# Patient Record
Sex: Male | Born: 1955 | Race: Black or African American | Hispanic: No | Marital: Married | Smoking: Former smoker
Health system: Southern US, Community
[De-identification: ages and names within clinical notes are randomized; demographics above are authoritative.]

## PROBLEM LIST (undated history)

## (undated) DIAGNOSIS — J45909 Unspecified asthma, uncomplicated: Secondary | ICD-10-CM

## (undated) DIAGNOSIS — F329 Major depressive disorder, single episode, unspecified: Secondary | ICD-10-CM

## (undated) DIAGNOSIS — R12 Heartburn: Secondary | ICD-10-CM

## (undated) DIAGNOSIS — F32A Depression, unspecified: Secondary | ICD-10-CM

## (undated) DIAGNOSIS — I10 Essential (primary) hypertension: Secondary | ICD-10-CM

## (undated) DIAGNOSIS — C61 Malignant neoplasm of prostate: Secondary | ICD-10-CM

## (undated) HISTORY — DX: Major depressive disorder, single episode, unspecified: F32.9

## (undated) HISTORY — DX: Depression, unspecified: F32.A

## (undated) HISTORY — PX: KNEE SURGERY: SHX244

## (undated) HISTORY — DX: Unspecified asthma, uncomplicated: J45.909

---

## 2002-11-10 ENCOUNTER — Encounter: Payer: Self-pay | Admitting: Family Medicine

## 2002-11-10 HISTORY — PX: COLONOSCOPY: SHX174

## 2009-11-29 ENCOUNTER — Ambulatory Visit: Payer: Self-pay | Admitting: Family Medicine

## 2009-11-29 DIAGNOSIS — M109 Gout, unspecified: Secondary | ICD-10-CM | POA: Insufficient documentation

## 2009-12-14 ENCOUNTER — Ambulatory Visit: Payer: Self-pay | Admitting: Family Medicine

## 2009-12-21 ENCOUNTER — Encounter: Payer: Self-pay | Admitting: Family Medicine

## 2009-12-22 LAB — CONVERTED CEMR LAB
Albumin: 4.6 g/dL (ref 3.5–5.2)
BUN: 20 mg/dL (ref 6–23)
Chloride: 106 meq/L (ref 96–112)
Creatinine, Ser: 1.37 mg/dL (ref 0.40–1.50)
Glucose, Bld: 104 mg/dL — ABNORMAL HIGH (ref 70–99)
PSA: 2.09 ng/mL (ref 0.10–4.00)
Potassium: 4.5 meq/L (ref 3.5–5.3)
Sodium: 140 meq/L (ref 135–145)
Total Bilirubin: 0.5 mg/dL (ref 0.3–1.2)
Triglycerides: 97 mg/dL (ref <150)
Uric Acid, Serum: 8.9 mg/dL — ABNORMAL HIGH (ref 4.0–7.8)

## 2010-01-16 ENCOUNTER — Telehealth: Payer: Self-pay | Admitting: Family Medicine

## 2010-01-25 ENCOUNTER — Telehealth: Payer: Self-pay | Admitting: Family Medicine

## 2010-01-29 ENCOUNTER — Ambulatory Visit: Payer: Self-pay | Admitting: Family Medicine

## 2010-01-29 DIAGNOSIS — E785 Hyperlipidemia, unspecified: Secondary | ICD-10-CM

## 2010-01-30 ENCOUNTER — Telehealth: Payer: Self-pay | Admitting: Family Medicine

## 2010-02-05 ENCOUNTER — Telehealth: Payer: Self-pay | Admitting: Family Medicine

## 2010-07-04 ENCOUNTER — Telehealth: Payer: Self-pay | Admitting: Family Medicine

## 2010-09-12 ENCOUNTER — Ambulatory Visit
Admission: RE | Admit: 2010-09-12 | Discharge: 2010-09-12 | Payer: Self-pay | Source: Home / Self Care | Attending: Family Medicine | Admitting: Family Medicine

## 2010-09-12 ENCOUNTER — Encounter: Payer: Self-pay | Admitting: Family Medicine

## 2010-09-12 DIAGNOSIS — L738 Other specified follicular disorders: Secondary | ICD-10-CM | POA: Insufficient documentation

## 2010-09-12 LAB — CONVERTED CEMR LAB
Bilirubin Urine: NEGATIVE
Glucose, Urine, Semiquant: NEGATIVE
Ketones, urine, test strip: NEGATIVE
Protein, U semiquant: NEGATIVE
Specific Gravity, Urine: 1.02

## 2010-09-13 LAB — CONVERTED CEMR LAB
Basophils Relative: 0 % (ref 0–1)
Eosinophils Absolute: 0.1 10*3/uL (ref 0.0–0.7)
Lymphs Abs: 2.4 10*3/uL (ref 0.7–4.0)
MCHC: 32.9 g/dL (ref 30.0–36.0)
RBC: 4.84 M/uL (ref 4.22–5.81)
RDW: 14.4 % (ref 11.5–15.5)
Uric Acid, Serum: 7.1 mg/dL (ref 4.0–7.8)

## 2010-09-14 ENCOUNTER — Telehealth: Payer: Self-pay | Admitting: Family Medicine

## 2010-09-14 ENCOUNTER — Encounter
Admission: RE | Admit: 2010-09-14 | Discharge: 2010-09-14 | Payer: Self-pay | Source: Home / Self Care | Attending: Family Medicine | Admitting: Family Medicine

## 2010-09-14 ENCOUNTER — Encounter: Payer: Self-pay | Admitting: Family Medicine

## 2010-09-15 ENCOUNTER — Encounter: Payer: Self-pay | Admitting: Family Medicine

## 2010-09-15 ENCOUNTER — Ambulatory Visit
Admission: RE | Admit: 2010-09-15 | Discharge: 2010-09-15 | Payer: Self-pay | Source: Home / Self Care | Admitting: Family Medicine

## 2010-09-18 ENCOUNTER — Telehealth: Payer: Self-pay | Admitting: Family Medicine

## 2010-09-19 ENCOUNTER — Encounter: Payer: Self-pay | Admitting: Family Medicine

## 2010-09-26 ENCOUNTER — Encounter: Payer: Self-pay | Admitting: Family Medicine

## 2010-10-16 NOTE — Progress Notes (Signed)
Summary: Crestor SE  Phone Note Call from Patient   Caller: Patient Summary of Call: Pt states Crestor is causing muscle aches, fatigue, and dizziness. Pt dc'd Rx last Thursday. Pt would like to know if there is anything besides Rx that he can try to help lower cholesterol. Please advise. Initial call taken by: Payton Spark CMA,  Feb 05, 2010 9:19 AM  Follow-up for Phone Call        Let's try Fenofibrate once a day.  It is non-statin cholesterol treatment.  Take daily and repeat labs in 3 mos. Follow-up by: Seymour Bars DO,  Feb 05, 2010 9:41 AM   New Allergies: ! * STATINS New/Updated Medications: FENOFIBRATE 160 MG TABS (FENOFIBRATE) 1 tab by mouth daily New Allergies: ! * STATINSPrescriptions: FENOFIBRATE 160 MG TABS (FENOFIBRATE) 1 tab by mouth daily  #30 x 2   Entered and Authorized by:   Seymour Bars DO   Signed by:   Seymour Bars DO on 02/05/2010   Method used:   Print then Give to Patient   RxID:   [id number]   Appended Document: Crestor SE Spent several minutes on phone w/ Pt. Pt upset bc he has had problems w/ med SE since he started coming her. I explained to Pt that there is no way to tell what SE he will have if he has never tried meds before. I asked Pt to see if SE improve over the next couple of days and if no better call for OV. Pt will wait to start new Rx when all SE are gone.

## 2010-10-16 NOTE — Procedures (Signed)
Summary: Colonoscopy/[facility]  Colonoscopy/[facility]   Imported By: Lanelle Bal 12/20/2009 14:23:18  _____________________________________________________________________  External Attachment:    Type:   Image     Comment:   External Document

## 2010-10-16 NOTE — Assessment & Plan Note (Signed)
Summary: NOV: CPE   Vital Signs:  Patient profile:   55 year old male Height:      72.5 inches Weight:      250 pounds BMI:     33.56 Pulse rate:   76 / minute BP sitting:   134 / 88  (left arm) Cuff size:   large  Vitals Entered By: Kathlene November (December 14, 2009 9:16 AM) CC: NP- CPE Is Patient Diabetic? No   Primary Care Provider:  Nani Gasser MD  CC:  NP- CPE.  History of Present Illness: Has a lump on the right inner knee. Was working out and fell and hit his knee on a bar. Noticed a lump about 2 days later and hasn't resolved since then. Occ tender but otherwise doesn't really bother him.    Was seen in [facility] for gout but couldn't get his colchicine because the pharmacy didn't carry it so would like  me to send  a new rx to a diff pharm that does carry it.  He says hasn't toleratated allopurinol in the past. He has not tried Uloric.   Habits & Providers  Alcohol-Tobacco-Diet     Alcohol drinks/day: <1     Tobacco Status: quit     Year Quit: 2009  Exercise-Depression-Behavior     Does Patient Exercise: no     STD Risk: never     Drug Use: never     Seat Belt Use: always  Current Medications (verified): 1)  Indomethacin 50 Mg Caps (Indomethacin) .Marland Kitchen.. 1 By Mouth 3 Times Daily With Food 2)  Colchicine 0.6 Mg Tabs (Colchicine) .... Sig 1 By Mouth Q Day May Take Up To 3 Aday Prn 3)  Hydrocodone-Acetaminophen 5-325 Mg Tabs (Hydrocodone-Acetaminophen) .... Sig 1 By Mouth Q6-8hrs  Allergies (verified): 1)  ! Codeine 2)  Allopurinol  Comments:  Nurse/Medical Assistant: The patient's medications and allergies were reviewed with the patient and were updated in the Medication and Allergy Lists. Kathlene November (December 14, 2009 9:17 AM)  Past History:  Past Surgical History: Knee-Rt, athroscopic surgery.   Family History: Mother, D, Diabetes CHF, hi chol, HTN passed away age 51.  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  Non-smoker ETOH-yes No Drugs HVAC 1 caffeinated drink per day. Smoking Status:  quit Does Patient Exercise:  no STD Risk:  never Drug Use:  never Seat Belt Use:  always  Review of Systems       No fever/sweats/weakness, + unexplained weight loss/gain.  No vison changes.  No difficulty hearing/ringing in ears, hay fever/allergies.  No chest pain/discomfort, palpitations.  No Br lump/nipple discharge.  No cough/wheeze.  No blood in BM, nausea/vomiting/diarrhea.  No nighttime urination, leaking urine, unusual vaginal bleeding, discharge (penis or vagina).  + muscle/joint pain. No rash, change in mole.  No HA, memory loss.  + anxiety, sleep d/o, no depression.  No easy bruising/bleeding, + unexplained lump   Physical Exam  General:  Well-developed,well-nourished,in no acute distress; alert,appropriate and cooperative throughout examination Head:  Normocephalic and atraumatic without obvious abnormalities. No apparent alopecia or balding. Eyes:  No corneal or conjunctival inflammation noted. EOMI. Perrla. Ears:  External ear exam shows no significant lesions or deformities.  Otoscopic examination reveals clear canals, tympanic membranes are intact bilaterally without bulging, retraction, inflammation or discharge. Hearing is grossly normal bilaterally. Nose:  External nasal examination shows no deformity or inflammation. Nasal mucosa are pink and  moist without lesions or exudates. Mouth:  Oral mucosa and oropharynx without lesions or exudates.  Teeth in good repair. Neck:  No deformities, masses, or tenderness noted. No TM.  Chest Wall:  No deformities, masses, tenderness or gynecomastia noted. Lungs:  Normal respiratory effort, chest expands symmetrically. Lungs are clear to auscultation, no crackles or wheezes. Heart:  Normal rate and regular rhythm. S1 and S2 normal without gallop, murmur, click, rub or other extra sounds. Abdomen:  Bowel sounds positive,abdomen  soft and non-tender without masses, organomegaly or hernias noted. Rectal:  no external abnormalities.   Prostate:  no gland enlargement.   Msk:  No deformity or scoliosis noted of thoracic or lumbar spine.   Pulses:  R and L carotid,radial,dorsalis pedis and posterior tibial pulses are full and equal bilaterally Extremities:  No clubbing, cyanosis, edema, or deformity noted with normal full range of motion of all joints.   Neurologic:  No cranial nerve deficits noted. Station and gait are normal.DTRs are symmetrical throughout. Sensory, motor and coordinative functions appear intact. Skin:  4 cm firm nodule on the rt inner knee. Nontender.  No redness or lesion over the skin.  Cervical Nodes:  No lymphadenopathy noted Psych:  Cognition and judgment appear intact. Alert and cooperative with normal attention span and concentration. No apparent delusions, illusions, hallucinations   Impression & Recommendations:  Problem # 1:  HEALTH MAINTENANCE EXAM (ICD-V70.0) Tdap given today.  Dicussed regular diet and exercise.  Prostate exam was normal, will check PSA as well.  Due for screening labs.  Orders: T-Comprehensive Metabolic Panel [phone]) T-PSA [phone]) T-Lipid Profile [phone])  Problem # 2:  GOUT (ICD-274.9) Will check uric acid.  He would be a good candidate for trying Uloric since hasn't tolerated allopurinol in the past.  The following medications were removed from the medication list:    Colcrys 0.6 Mg Tabs (Colchicine) .Marland Kitchen... Prn His updated medication list for this problem includes:    Indomethacin 50 Mg Caps (Indomethacin) .Marland Kitchen... 1 by mouth 3 times daily with food    Colchicine 0.6 Mg Tabs (Colchicine) ..... Sig 1 by mouth q day may take up to 3 aday prn  Orders: T-Uric Acid (Blood) ([id number])  Complete Medication List: 1)  Indomethacin 50 Mg Caps (Indomethacin) .Marland Kitchen.. 1 by mouth 3 times daily with food 2)  Colchicine 0.6 Mg Tabs (Colchicine) .... Sig 1 by  mouth q day may take up to 3 aday prn 3)  Hydrocodone-acetaminophen 5-325 Mg Tabs (Hydrocodone-acetaminophen) .... Sig 1 by mouth q6-8hrs  Other Orders: Tdap => 53yrs IM (28413) Admin 1st Vaccine (24401)  Patient Instructions: 1)  Encouarage regular exercise.   2)  We will call you with your lab results.   3)  You received Tdap vaccine today.   Prescriptions: COLCHICINE 0.6 MG TABS (COLCHICINE) sig 1 by mouth q day may take up to 3 aday prn  #60 x 0   Entered and Authorized by:   Nani Gasser MD   Signed by:   Nani Gasser MD on 12/14/2009   Method used:   [facility] [phone] (retail[facility]       [address]       [place], [place]  [address]       Ph: [phone] or [phone]       Fax: [phone]   RxID:   [id number]   TD Next Due:  10 yr Flex Sig Next Due:  Not Indicated Colonoscopy  Result Date:  09/16/2006 Colonoscopy Result:  normal Hemoccult Next Due:  Not Indicated     Immunizations Administered:  Tetanus Vaccine:    Vaccine Type: Tdap    Site: left deltoid    Mfr: GlaxoSmithKline    Dose: 0.5 ml    Route: IM    Given by: Kathlene November    Exp. Date: 12/09/2011    Lot #: [id number]    VIS given: 08/04/07 version given December 14, 2009.    Appended Document: NOV: CPE  Colonoscopy Next Due:  10 yr    Preventive Care Screening  Colonoscopy:    Date:  09/16/2006    Next Due:  11/2012    Results:  normal Performed at [facility].

## 2010-10-16 NOTE — Progress Notes (Signed)
Summary: Colchicine refills  Phone Note Refill Request   Refills Requested: Medication #1:  COLCHICINE 0.6 MG TABS Take 1 tab by mouth once daily Rx needs to be faxed to [facility] in [facility] [phone]  Initial call taken by: Payton Spark CMA,  Jan 30, 2010 11:38 AM    Prescriptions: COLCHICINE 0.6 MG TABS (COLCHICINE) Take 1 tab by mouth once daily  #30 x 2   Entered and Authorized by:   Seymour Bars DO   Signed by:   Seymour Bars DO on 01/30/2010   Method used:   Print then Give to Patient   RxID:   [id number]   Appended Document: Colchicine refills Faxed

## 2010-10-16 NOTE — Progress Notes (Signed)
Summary: Side effects from med  Phone Note Call from Patient Call back at [facility] Phone [phone]   Summary of Call: Pt on Uloric and simvastatin- hands , back, feet sore, tires easily. ? side effect to med or interactions between the two Initial call taken by: Kathlene November,  Jan 16, 2010 3:51 PM  Follow-up for Phone Call        These 2 drugs don't interact but could try cutting the simvastatin in half for a couple of weeks ans see if feel any better.  Follow-up by: Nani Gasser MD,  Jan 16, 2010 4:15 PM  Additional Follow-up for Phone Call Additional follow up Details #1::        Pt notified of above instructions. Additional Follow-up by: Kathlene November,  Jan 16, 2010 4:36 PM

## 2010-10-16 NOTE — Progress Notes (Signed)
Summary: Indomethicin  Phone Note Call from Patient   Caller: Patient Call For: Nani Gasser MD Summary of Call: Pt would like to know if you could give him a refill on the Indomethicin. Last filled 11/29/2009 by Dr. Thurmond Butts and hasn't gotten from you. Indomethicin 50mg  one capsule three times a day with food. Having flare Initial call taken by: Kathlene November LPN,  July 04, 2010 9:15 AM  Follow-up for Phone Call        Needs f/u for cholesterol and teh gout.  He really needs to be on uloric or allopurinol. I know at the time was havgin SE that were either from teh choleserol pill or the uloric.  Follow-up by: Nani Gasser MD,  July 04, 2010 12:31 PM    New/Updated Medications: INDOMETHACIN 50 MG CAPS (INDOMETHACIN) Take 1 tablet by mouth three times a day with food for gout flare. Prescriptions: INDOMETHACIN 50 MG CAPS (INDOMETHACIN) Take 1 tablet by mouth three times a day with food for gout flare.  #60 x 0   Entered and Authorized by:   Nani Gasser MD   Signed by:   Nani Gasser MD on 07/04/2010   Method used:   [facility]        [phone][facility] (retail)       [address] [place], [place]  [address]       Ph: [phone] or [phone]       Fax: [phone]   RxID:   [id number]

## 2010-10-16 NOTE — Progress Notes (Signed)
Summary: stopped cholesterol med  Phone Note Call from Patient Call back at Home Phone [phone] Call back at [phone]   Caller: Patient Call For: Nani Gasser MD Summary of Call: Pt stopped the Simvastain due to muslce aches and jaw pain is feeling some better off of it but still hurting. Still having gout signs's even on the Uloric. Initial call taken by: Kathlene November,  Jan 25, 2010 11:04 AM  Follow-up for Phone Call        schedule an appt next wk. Follow-up by: Seymour Bars DO,  Jan 25, 2010 11:54 AM     Appended Document: stopped cholesterol med 01/25/2010 @ 11:55am-Pt notified of MD instructions. KJ LPN

## 2010-10-16 NOTE — Assessment & Plan Note (Signed)
Summary: GOUT IN L FOOT/KH   Vital Signs:  Patient Profile:   55 Years Old Male CC:      Gout in left foot x 2 days Height:     72 inches Weight:      235 pounds O2 Sat:      98 % O2 treatment:    Room Air Temp:     98.9 degrees F oral Pulse rate:   86 / minute Pulse rhythm:   regular Resp:     16 per minute BP sitting:   148 / 101  (right arm) Cuff size:   regular  Vitals Entered By: Avel Sensor, CMA                  Prior Medication List:  No prior medications documented  Current Allergies (reviewed today): ! CODEINEHistory of Present Illness Chief Complaint: Gout in left foot x 2 days History of Present Illness: Gout in L foot . He has been on colchicine before long term but failed taking allopurinol. He had a flare that started yesterday.   Current Problems: GOUT (ICD-274.9) GOUT (ICD-274.9)   Current Meds COLCRYS 0.6 MG TABS (COLCHICINE) prn INDOMETHACIN 50 MG CAPS (INDOMETHACIN) 1 by mouth 3 times daily with food COLCHICINE 0.6 MG TABS (COLCHICINE) sig 1 by mouth q day may take up to 3 aday prn HYDROCODONE-ACETAMINOPHEN 5-325 MG TABS (HYDROCODONE-ACETAMINOPHEN) sig 1 by mouth q6-8hrs  REVIEW OF SYSTEMS Constitutional Symptoms      Denies fever, chills, night sweats, weight loss, weight gain, and fatigue.  Eyes       Denies change in vision, eye pain, eye discharge, glasses, contact lenses, and eye surgery. Ear/Nose/Throat/Mouth       Denies hearing loss/aids, change in hearing, ear pain, ear discharge, dizziness, frequent runny nose, frequent nose bleeds, sinus problems, sore throat, hoarseness, and tooth pain or bleeding.  Respiratory       Denies dry cough, productive cough, wheezing, shortness of breath, asthma, bronchitis, and emphysema/COPD.  Cardiovascular       Denies murmurs, chest pain, and tires easily with exhertion.    Gastrointestinal       Denies stomach pain, nausea/vomiting, diarrhea, constipation, blood in bowel movements, and  indigestion. Genitourniary       Denies painful urination, kidney stones, and loss of urinary control. Neurological       Denies paralysis, seizures, and fainting/blackouts. Musculoskeletal       Complains of muscle pain, joint pain, joint stiffness, and redness.      Denies decreased range of motion, swelling, muscle weakness, and gout.  Skin       Denies bruising, unusual mles/lumps or sores, and hair/skin or nail changes.  Psych       Denies mood changes, temper/anger issues, anxiety/stress, speech problems, depression, and sleep problems.  Past History:  Family History: Last updated: 11/29/2009 Mother, D, Diabetes CHF father, Diabetes  Social History: Last updated: 11/29/2009 Non-smoker ETOH-yes No Drugs HVAC  Past Medical History: Gout  Past Surgical History: Knee-Rt   Family History: Reviewed history and no changes required. Mother, D, Diabetes CHF father, Diabetes  Social History: Reviewed history and no changes required. Non-smoker ETOH-yes No Drugs HVAC Physical Exam General appearance: well developed, well nourished, no acute distress Head: normocephalic, atraumatic Extremities: L foot is swollen over the L metacarpel joint Skin: no obvious rashes or lesions MSE: oriented to time, place, and person Assessment New Problems: GOUT (ICD-274.9) GOUT (ICD-274.9)  gout  Patient Education: Patient and/or  caregiver instructed in the following: rest fluids and Tylenol.  Plan New Medications/Changes: HYDROCODONE-ACETAMINOPHEN 5-325 MG TABS (HYDROCODONE-ACETAMINOPHEN) sig 1 by mouth q6-8hrs  #20 x 0, 11/29/2009, Hassan Rowan MD COLCHICINE 0.6 MG TABS (COLCHICINE) sig 1 by mouth q day may take up to 3 aday prn  #60 x 0, 11/29/2009, Hassan Rowan MD INDOMETHACIN 50 MG CAPS (INDOMETHACIN) 1 by mouth 3 times daily with food  #30 x 0, 11/29/2009, Hassan Rowan MD  New Orders: New Patient Level III [phone] Solumedrol up to 125mg  [J2930] Admin of Therapeutic Inj  (IM or [place]) [02725] Ketorolac-Toradol 15mg  [J1885] Solumedrol up to 125mg  [J2930] Admin of Therapeutic Inj  intramuscular or subcutaneous [96372] Planning Comments:   follow up w/ PCP  Follow Up: Follow up in 1-2 days if no improvement, Follow up in 2-3 days if no improvement, Follow up with Primary Physician  The patient and/or caregiver has been counseled thoroughly with regard to medications prescribed including dosage, schedule, interactions, rationale for use, and possible side effects and they verbalize understanding.  Diagnoses and expected course of recovery discussed and will return if not improved as expected or if the condition worsens. Patient and/or caregiver verbalized understanding.  Prescriptions: HYDROCODONE-ACETAMINOPHEN 5-325 MG TABS (HYDROCODONE-ACETAMINOPHEN) sig 1 by mouth q6-8hrs  #20 x 0   Entered and Authorized by:   Hassan Rowan MD   Signed by:   Hassan Rowan MD on 11/29/2009   Method used:   Print then Give to Patient   RxID:   [id number] COLCHICINE 0.6 MG TABS (COLCHICINE) sig 1 by mouth q day may take up to 3 aday prn  #60 x 0   Entered and Authorized by:   Hassan Rowan MD   Signed by:   Hassan Rowan MD on 11/29/2009   Method used:   Print then Give to Patient   RxID:   [id number] INDOMETHACIN 50 MG CAPS (INDOMETHACIN) 1 by mouth 3 times daily with food  #30 x 0   Entered and Authorized by:   Hassan Rowan MD   Signed by:   Hassan Rowan MD on 11/29/2009   Method used:   Print then Give to Patient   RxID:   [id number]   Patient Instructions: 1)  Please schedule a follow-up appointment as needed. 2)  Please schedule an appointment with your primary doctor inas scheduled.  3)  May need to continue colchicine daily and another medication since unable to tolerate allopurinol.   Medication Administration  Injection # 1:    Medication: Solumedrol up to 125mg     Diagnosis: GOUT (ICD-274.9)    Route: IM    Site: LUOQ gluteus    Exp Date:  05/17/2012    Lot #: [id number]    Mfr: Novaplus    Patient tolerated injection without complications    Given by: Emilio Math (November 29, 2009 1:05 PM)  Injection # 2:    Medication: Ketorolac-Toradol 15mg     Diagnosis: GOUT (ICD-274.9)    Route: IM    Site: LUOQ gluteus    Exp Date: 06/17/2011    Lot #: [id number]    Mfr: Hospira    Comments: x4    Given by: Emilio Math (November 29, 2009 1:08 PM)  Orders Added: 1)  New Patient Level III [99203] 2)  Solumedrol up to 125mg  [J2930] 3)  Admin of Therapeutic Inj (IM or [place]) [96372] 4)  Ketorolac-Toradol 15mg  [J1885] 5)  Solumedrol up to 125mg  [J2930] 6)  Admin of Therapeutic Inj  intramuscular or subcutaneous [25427]

## 2010-10-16 NOTE — Assessment & Plan Note (Signed)
Summary: med SEs/ high chol/ gout   Vital Signs:  Patient profile:   55 year old male Height:      72.5 inches Weight:      245 pounds BMI:     32.89 O2 Sat:      97 % on Room air Pulse rate:   68 / minute BP sitting:   134 / 94  (left arm) Cuff size:   large  Vitals Entered By: Payton Spark CMA (Jan 29, 2010 8:19 AM)  O2 Flow:  Room air CC: F/U. Discuss simvastatin and uloric SE's.   Primary Care Provider:  Nani Gasser MD  CC:  F/U. Discuss simvastatin and uloric SE's..  History of Present Illness: 55 yo AAM presents for f/u high cholesterol and gout.  Dr Linford Arnold started him on Simvastatin last month and he started having myalgias and jaw pain right away.  He had a uric acid level of 8.9.  He was started on Uloric.  His 'reaction' to allopurinol in the past was gouty exacerbation, not a true allergy.  Before, he was doing fine on one tab of colchine daily and rarely having gouty attacks.  He stopped the uloric at the same time as the statin b/c he was not sure which gave himt he SEs that are now gone.    Current Medications (verified): 1)  Colchicine 0.6 Mg Tabs (Colchicine) .... Sig 1 By Mouth Q Day May Take Up To 3 Aday Prn 2)  Hydrocodone-Acetaminophen 5-325 Mg Tabs (Hydrocodone-Acetaminophen) .... Sig 1 By Mouth Q6-8hrs  Allergies (verified): 1)  ! Codeine  Past History:  Past Medical History: Gout high cholesterol class I obesity  Past Surgical History: Reviewed history from 12/14/2009 and no changes required. Knee-Rt, athroscopic surgery.   Social History: Reviewed history from 12/14/2009 and no changes required. 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  Non-smoker ETOH-yes No Drugs HVAC 1 caffeinated drink per day.   Review of Systems  The patient denies fever, chest pain, and dyspnea on exertion.    Physical Exam  General:  alert, well-developed, well-nourished, well-hydrated, and overweight-appearing.     Head:  normocephalic, atraumatic, and male-pattern balding.   Eyes:  sclera non icteric; PERRLA Nose:  no nasal discharge.   Mouth:  good dentition and pharynx pink and moist.   Neck:  no masses.   Lungs:  Normal respiratory effort, chest expands symmetrically. Lungs are clear to auscultation, no crackles or wheezes. Heart:  Normal rate and regular rhythm. S1 and S2 normal without gallop, murmur, click, rub or other extra sounds. Msk:  no joint swelling, no joint warmth, and no redness over joints.   Pulses:  2+ radial pulses Extremities:  no UE or LE edema Skin:  color normal.   Cervical Nodes:  No lymphadenopathy noted Psych:  good eye contact, not anxious appearing, and not depressed appearing.     Impression & Recommendations:  Problem # 1:  HYPERLIPIDEMIA (ICD-272.4) SEs from simvastatin, never tried other statins.  Will give him samples of Crestor 10 mg to break in half at bedtime.  RTC in 1 month to see how he is doing.  Call if any problems.   The following medications were removed from the medication list:    Simvastatin 40 Mg Tabs (Simvastatin) .Marland Kitchen... Take 1 tablet by mouth once a day at bedtime His updated medication list for this problem includes:    Crestor 10 Mg Tabs (Rosuvastatin calcium) .Marland Kitchen... 1/2 tab by  mouth qhs  Labs Reviewed: SGOT: 21 (12/21/2009)   SGPT: 16 (12/21/2009)   HDL:53 (12/21/2009)  LDL:164 (12/21/2009)  Chol:236 (12/21/2009)  Trig:97 (12/21/2009)  Problem # 2:  GOUT (ICD-274.9) Off uloric and feeling better (which may have been from the statin SE).  For now, will keep him on one tab of colchicine daily but will consider either retry of uloric or allopurinol as both of these meds can exacerbate gout w/o an NSAID on board.  Will work to get a stable cholsterol med first. The following medications were removed from the medication list:    Uloric 40 Mg Tabs (Febuxostat) .Marland Kitchen... Take 1 tablet by mouth once a day His updated medication list for this problem  includes:    Colchicine 0.6 Mg Tabs (Colchicine) ..... Sig 1 by mouth q day may take up to 3 aday prn  Problem # 3:  ELEVATED BLOOD PRESSURE WITHOUT DIAGNOSIS OF HYPERTENSION (ICD-796.2) Assessment: New BP high today even on recheck. Given h/o from [email or web address] on DASH diet and encouraged him to adopt a healthier diet with regular exercise for elevated BP and cholesterol.  REcheck BP at next visit prior to a label of HTN.  Complete Medication List: 1)  Colchicine 0.6 Mg Tabs (Colchicine) .... Sig 1 by mouth q day may take up to 3 aday prn 2)  Crestor 10 Mg Tabs (Rosuvastatin calcium) .... 1/2 tab by mouth qhs  Patient Instructions: 1)  Start 1/2 tab of Crestor each night for high cholesterol. 2)  If you tolerate it well, will prescribe. 3)  Stay on 1 tab of colchicine daily for gout. 4)  Return for f/u elevated BP/ gout/ high cholesterol in 1 month.

## 2010-10-18 NOTE — Assessment & Plan Note (Signed)
Summary: Gout, folliculties   Vital Signs:  Patient profile:   55 year old male Height:      72.5 inches Weight:      242 pounds Pulse rate:   84 / minute BP sitting:   165 / 108  (right arm) Cuff size:   large  Vitals Entered By: Avon Gully CMA, Duncan Dull) (September 12, 2010 3:15 PM) CC: gout flare?,back pain feet and legs hurt and rt hand hurts   Primary Care Provider:  Nani Gasser MD  CC:  gout flare? and back pain feet and legs hurt and rt hand hurts.  History of Present Illness: Having pain in his feet and it moves around.Last night in his right thumb and the right lateral ankle.  The indomethacine is making nauseated. His back is also bother im him. Says so stiff and painful yesterday had a hard time getting out of bed. Says in the low back and radiates upwards to the right side. No radiation into the legs.  Says is some beter today.  Thinks it is gout in his back. He has tried allopurinol and uloric in the past. Teh uloric made him SOB.  Says the allopurinol made his sxs worse.   Also rash on the back of his scalp. Notices it comes when his gout flares. AOften takes weaks fo the bumps to resolve. Occ tender but most of the time not bothersome.  NO worsening or alleivating sxs. NO meds.   Current Medications (verified): 1)  Indomethacin 50 Mg Caps (Indomethacin) .... Take 1 Tablet By Mouth Three Times A Day With Food For Gout Flare.  Allergies (verified): 1)  ! Codeine 2)  ! * Statins  Comments:  Nurse/Medical Assistant: The patient's medications and allergies were reviewed with the patient and were updated in the Medication and Allergy Lists. Avon Gully CMA, Duncan Dull) (September 12, 2010 3:16 PM)  Physical Exam  General:  Well-developed,well-nourished,in no acute distress; alert,appropriate and cooperative throughout examination Msk:  Mild inflammation and redness of the left great toe joint No redness but some tenderness over the lateral malleolus.  Right  thumb base is tender but no redness today.  Skin:  Back of scalp with some small erythematous follicular papules. No pustules.    Impression & Recommendations:  Problem # 1:  GOUT (ICD-274.9) Need to go back to Purine free diet. I suspect Holiday foods have worsened his gout. Dsicussed the need for prophylaxis and that he may flar more frequently for the first 6 months.  Start prednisone for this acute flare. Given a PPI to take with it then when finished can start the indomethicin once a day.    His updated medication list for this problem includes:    Indomethacin 50 Mg Caps (Indomethacin) .Marland Kitchen... Take 1 tablet by mouth once a day    Allopurinol 100 Mg Tabs (Allopurinol) .Marland Kitchen... Take 1 tablet by mouth once a day  Orders: T-Uric Acid (Blood) (11914-78295) T-CBC w/Diff (62130-86578) UA Dipstick w/o Micro (automated)  (81003)  Problem # 2:  FOLLICULITIS (ICD-704.8) Discussed can use topical metro gel for as needed use when sees the bumps are breaking out. Avoid friction like hats over the area.   Complete Medication List: 1)  Indomethacin 50 Mg Caps (Indomethacin) .... Take 1 tablet by mouth once a day 2)  Allopurinol 100 Mg Tabs (Allopurinol) .... Take 1 tablet by mouth once a day 3)  Prednisone 20 Mg Tabs (Prednisone) .... 2 tab by mouth for 7 days, then increase to  tab for one week. 4)  Metronidazole 0.75 % Gel (Metronidazole) .... Apply two times a day to affected area on neck for 7 days.  Patient Instructions: 1)  Start your allopurinol today once a day.   2)  Start the prednisone today.  3)  Take the stomach medicine (samples once a day about 20 min before breakfast) 4)  Once finish the prednisone then start the indomethicin once a day wiht the allopurinol.   5)  Follow up in 2 months.  Prescriptions: METRONIDAZOLE 0.75 % GEL (METRONIDAZOLE) apply two times a day to affected area on neck for 7 days.  #1 tube x 2   Entered and Authorized by:   Nani Gasser MD   Signed by:    Nani Gasser MD on 09/12/2010   Method used:   [facility] [phone][facility] (retail)       [address]       [place], [place]  [address]       Ph: [phone] or [phone]       Fax: [phone]   RxID:   [id number] INDOMETHACIN 50 MG CAPS (INDOMETHACIN) Take 1 tablet by mouth once a day  #30 x 1   Entered and Authorized by:   Nani Gasser MD   Signed by:   Nani Gasser MD on 09/12/2010   Method used:   Elect[facility]ically to        [facility] [phone][facility] (retail)       [address]       [place], [place]  [address]       Ph: [phone] or [phone]       Fax: [phone]   RxID:   [id number] PREDNISONE 20 MG TABS (PREDNISONE) 2 tab by mouth for 7 days, then increase to  tab for one week.  #21 x 0   Entered and Authorized by:   Nani Gasser MD   Signed by:   Nani Gasser MD on 09/12/2010   Method used:   [facility] to        [facility] [phone]* (retail)       [address]       [place], [place]  [address]       Ph: [phone] or [phone]       Fax: [phone]   RxID:   [id number] ALLOPURINOL 100 MG TABS (ALLOPURINOL) Take 1 tablet by mouth once a day  #30 x 1   Entered and Authorized by:   Nani Gasser MD   Signed by:   Nani Gasser MD on 09/12/2010   Method used:   Electronically to        [facility] [phone][facility] (retail)       [address]       [place], [place]  [address]       Ph: [phone] or [phone]       Fax: [phone]   RxID:   [id number]    Orders Added: 1)  T-Uric Acid (Blood) [84550-23180] 2)  T-CBC w/Diff [99833-82505] 3)  UA Dipstick w/o Micro (automated)  [81003] 4)  Est. Patient Level IV [39767]    Laboratory Results   Urine Tests  Date/Time Received: 09/12/10 Date/Time Reported: 09/12/10  Routine Urinalysis   Color: yellow Appearance: Clear Glucose: negative   (Normal Range: Negative) Bilirubin: negative   (Normal Range:  Negative) Ketone: negative   (Normal Range:  Negative) Spec. Gravity: 1.020   (Normal Range: 1.003-1.035) Blood: negative   (Normal Range: Negative) pH: 5.5   (Normal Range: 5.0-8.0) Protein: negative   (Normal Range: Negative) Urobilinogen: 0.2   (Normal Range: 0-1) Nitrite: negative   (Normal Range: Negative) Leukocyte Esterace: negative   (Normal Range: Negative)

## 2010-10-18 NOTE — Letter (Signed)
Summary: [employer]  [facility] [employer]   Imported By: Lanelle Bal 10/11/2010 09:19:34  _____________________________________________________________________  External Attachment:    Type:   Image     Comment:   External Document

## 2010-10-18 NOTE — Letter (Signed)
Summary: Out of Work  [facility]  [address]   [place], [place] [address]   Phone: [phone]  Fax: [phone]    September 14, 2010   Employee:  KEITON COSMA    To Whom It May Concern:   For Medical reasons, please excuse the above named employee from work for the following dates:  Start:   09/14/2010  End:   09/17/2010  If you need additional information, please feel free to contact our office.         Sincerely,    Nani Gasser, MD

## 2010-10-18 NOTE — Progress Notes (Signed)
Summary: Gout and his work  Phone Note Call from Patient Call back at [phone]   Caller: Patient Call For: Nani Gasser MD Summary of Call: Bradley Harper to [facility] yesterday and was told to stop the Allopurinol and they put him on a pain med. Told Allopurinol was making the gout worse. Pt frustrated because he can't go back to work with ankle. Prednisone is helping but he is gonna be out and then his pain is gonna be worse. His work is wanting to put him on emergency duty which commits him to perform this duty. He wants to know what the treatment plan needs to be because he knows the gout is gonna come back and wants to be on some therapy that can get this under control. Indomethicin does not help the gout for him at all. Was given cortisone shot at [facility] which did help but pain is still there. 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 I f misses more than 3 days in a row I may have to fill out FMLA if that is what hsi work requires. Lets send himi to rheumatology then. Lets try to get him in quick and we can extend his steroids a little longer if needed if still having pain and inflamation.  Follow-up by: Nani Gasser MD,  September 18, 2010 1:54 PM  Additional Follow-up for Phone Call Additional follow up Details #1::        Pt needs letter stating that it would not be in his best interest health wise to go on the emergency duty because he will continue to have the gout flares and when has these can not work due to the pain and swelling that it causes. Also that when has flares until under control will have to miss work.  He did say that his HR dept said they dont  require FMLA papers to be filled out but they are giving him a form that will need to filled out and he will drop that by in the morning. His wife will pick up the letter this afternoon so he can get it to his job before they terminate him. Also [facility] [address] [facility] [place]. Willing to do the Rheumatology referral as well in [place] Additional Follow-up by: Kathlene November LPN,  September 18, 2010 2:09 PM    Additional Follow-up for Phone Call Additional follow up Details #2::    Please fax letter ot [phone] Attention Benedict Needy Follow-up by: Kathlene November LPN,  September 18, 2010 3:36 PM  Additional Follow-up for Phone Call Additional follow up Details #3:: Details for Additional Follow-up Action Taken: Letter printed. OK to fax.  09/19/2010 @ 8:06am- Letter faxed to above number Additional Follow-up by: Nani Gasser MD,  September 19, 2010 7:57 AM

## 2010-10-18 NOTE — Letter (Signed)
Summary: Generic Letter  [facility]  [address]   [place], [place] [address]   Phone: [phone]  Fax: [phone]    09/25/2010    In regards to Mr. Bradley Harper, Bradley Harper [address] [place], [place]  [address]  He is currently under my care for gout. We are having a difficult time controlling his pain and thus I have referred him to a rheumatologist. In the meantime I did give a stronger narcotic pain medicine to take in the evenings. He cannot take this medication and then be called into work on emergency duty as it is not safe for him to drive for 4 hours after he takes the medication. Thus for now I stand by my original statement that I do not recommend he be on emergency duty until he sees the rheumatologist.  I don't know if some other accomodation can be made.  At this point in time he can work his usual 40 hours per week, shifts of 8 hours, with no more than 8 hours in a day.  If you have any further questions please feel free to contact my office.  Sincerely,     Nani Gasser MD

## 2010-10-18 NOTE — Letter (Signed)
Summary: Out of Work  [facility]  [address]   [place], [place] [address]   Phone: [phone]  Fax: [phone]    September 15, 2010   Employee:  Bradley Harper    To Whom It May Concern:   For Medical reasons, please excuse the above named employee from work 09/17/10.    If you need additional information, please feel free to contact our office.         Sincerely,    Donna Christen MD

## 2010-10-18 NOTE — Letter (Signed)
Summary: Generic Letter  [facility]  [address]   [place], [place] [address]   Phone: [phone]  Fax: [phone]    09/19/2010  Bradley Harper [address] [place], [place]  [address]  Regarding [name],   He has a diagnosis of gout and we are currently struggling to get his symptoms under good control. Gout does flare unexpectedly from time to time. For then next 3 months I ask that you do not put him on emergency duty, because if he has to work that duty then the long hours will likely exacerbate his gout. We are working diligently to get him in with a rheumatologist but it will likey be a month before we can get him in. In the meantime he is on anti-inflammtory medication to help control his pain and swelling. Please let us know if we can provide any further information to assist you.   Sincerely,       Nani Gasser MD

## 2010-10-18 NOTE — Progress Notes (Signed)
Summary: Let Dr. Ashley Mariner he is on crutches now  Phone Note Call from Patient   Caller: Patient Summary of Call: pt. wanted to let you know that he is on crutches now... Thanks.Michaelle Copas  September 14, 2010 2:20 PM  Initial call taken by: Michaelle Copas,  September 14, 2010 2:20 PM  Follow-up for Phone Call        Why don't we get an xray. Can he come this afternoon for xray of his feet bilat. Which foot is the worst?  Follow-up by: Nani Gasser MD,  September 14, 2010 2:36 PM  Additional Follow-up for Phone Call Additional follow up Details #1::        Pt will come for xray. States its his ankles that are really bothering him. Send xray order to [facility] Additional Follow-up by: Kathlene November LPN,  September 14, 2010 2:39 PM    Additional Follow-up for Phone Call Additional follow up Details #2::    pt states left foot is the worse Follow-up by: Avon Gully CMA, Duncan Dull),  September 14, 2010 3:29 PM

## 2010-10-18 NOTE — Assessment & Plan Note (Signed)
Summary: GOUT/TM room 5   Vital Signs:  Patient Profile:   55 Years Old Male CC:      gout Height:     72.5 inches Weight:      242 pounds O2 Sat:      97 % O2 treatment:    Room Air Temp:     98.9 degrees F oral Pulse rate:   90 / minute Resp:     18 per minute BP sitting:   131 / 84  (right arm) Cuff size:   large  Vitals Entered By: Clemens Catholic LPN (September 15, 2010 12:44 PM)                  Updated Prior Medication List: INDOMETHACIN 50 MG CAPS (INDOMETHACIN) Take 1 tablet by mouth once a day ALLOPURINOL 100 MG TABS (ALLOPURINOL) Take 1 tablet by mouth once a day PREDNISONE 20 MG TABS (PREDNISONE) 2 tab by mouth for 7 days, then increase to  tab for one week. METRONIDAZOLE 0.75 % GEL (METRONIDAZOLE) apply two times a day to affected area on neck for 7 days. HYDROCODONE-ACETAMINOPHEN 5-500 MG TABS (HYDROCODONE-ACETAMINOPHEN) 1-2 tabs every 6 hours as needed  Current Allergies (reviewed today): ! CODEINE ! * STATINSHistory of Present Illness Chief Complaint: gout History of Present Illness:  Subjective:  Patient complains of a flareup of gout 4 days ago, despite having just finished a course of colcrys.  He has just started on a course of prednisone but has not yet seen improvement.  Recently started back on allopurinol.  Has tried uloric in past but had adverse effects.  Presently having pain in right knee, left ankle, and left foot.  No fever.  States he is watching his diet and drinking plenty of water.  REVIEW OF SYSTEMS Constitutional Symptoms      Denies fever, chills, night sweats, weight loss, weight gain, and fatigue.  Eyes       Denies change in vision, eye pain, eye discharge, glasses, contact lenses, and eye surgery. Ear/Nose/Throat/Mouth       Denies hearing loss/aids, change in hearing, ear pain, ear discharge, dizziness, frequent runny nose, frequent nose bleeds, sinus problems, sore throat, hoarseness, and tooth pain or bleeding.  Respiratory     Denies dry cough, productive cough, wheezing, shortness of breath, asthma, bronchitis, and emphysema/COPD.  Cardiovascular       Denies murmurs, chest pain, and tires easily with exhertion.    Gastrointestinal       Denies stomach pain, nausea/vomiting, diarrhea, constipation, blood in bowel movements, and indigestion. Genitourniary       Denies painful urination, kidney stones, and loss of urinary control. Neurological       Denies paralysis, seizures, and fainting/blackouts. Musculoskeletal       Complains of muscle pain, joint pain, joint stiffness, redness, and swelling.      Denies decreased range of motion, muscle weakness, and gout.  Skin       Denies bruising, unusual mles/lumps or sores, and hair/skin or nail changes.  Psych       Denies mood changes, temper/anger issues, anxiety/stress, speech problems, depression, and sleep problems. Other Comments: pt c/o left foot pain and ankle swelling. he states that he saw dr Linford Arnold on wed for gout in multiple places and was started on prednisone and allopurinol. pain started in eft foot yest. and dr Linford Arnold ordered xray which the pt states was negative. dr Linford Arnold started him on hydrocodone yesterday.    Past History:  Past Medical History: Reviewed history from 01/29/2010 and no changes required. Gout high cholesterol class I obesity  Past Surgical History: Reviewed history from 12/14/2009 and no changes required. Knee-Rt, athroscopic surgery.   Family History: Reviewed history from 12/14/2009 and no changes required. Mother, D, Diabetes CHF, hi chol, HTN passed away age 42.  father, Diabetes, hi chol  Social History: Reviewed history from 12/14/2009 and no changes required. 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  Non-smoker ETOH-yes No Drugs HVAC 1 caffeinated drink per day.    Objective:  No acute distress  Neck:  Supple.  No adenopathy is present.   Lungs:  Clear to  auscultation.  Breath sounds are equal.  Heart:  Regular rate and rhythm without murmurs, rubs, or gallops.  Abdomen:  Nontender without masses or hepatosplenomegaly.  Bowel sounds are present.  No CVA or flank tenderness.  Extremities:  No edema.  Right knee has vague tenderness to palpation and warmth.  Minimal swelling.  Left ankle is tender to palpation with minimal swelling.  Left foot has tenderness dorsally.  No erythema, swelling, but mild warmth present. Assessment  Assessed GOUT as deteriorated - Donna Christen MD PERSISTENT GOUT.  Plan New Medications/Changes: PERCOCET 5-325 MG TABS (OXYCODONE-ACETAMINOPHEN) 1 by mouth hs as needed pain  #7 (seven) x 0, 09/15/2010, Donna Christen MD  New Orders: Depo- Medrol 80mg  [J1040] Admin of Therapeutic Inj  intramuscular or subcutaneous [96372] Est. Patient Level III [72536] Planning Comments:   Will give Depo Medrol injection 80 mg; continue prednisone.  Discontinue allopurinol for now in case that may be contributing to his flare-up.  Rx for analgesic at bedtime.  Continue increased fluid intake and follow prescribed diet.  Follow-up with PCP.  May need to consult with rheumatologist.   The patient and/or caregiver has been counseled thoroughly with regard to medications prescribed including dosage, schedule, interactions, rationale for use, and possible side effects and they verbalize understanding.  Diagnoses and expected course of recovery discussed and will return if not improved as expected or if the condition worsens. Patient and/or caregiver verbalized understanding.  Prescriptions: PERCOCET 5-325 MG TABS (OXYCODONE-ACETAMINOPHEN) 1 by mouth hs as needed pain  #7 (seven) x 0   Entered and Authorized by:   Donna Christen MD   Signed by:   Donna Christen MD on 09/15/2010   Method used:   Print then Give to Patient   RxID:   [id number]   Medication Administration  Injection # 1:    Medication: Depo- Medrol 80mg     Diagnosis:  GOUT (ICD-274.9)    Route: IM    Site: RUOQ gluteus    Exp Date: 09/17/2011    Lot #: [id number]    Mfr: Pharmacia    Comments: given 2- 40mg  vails    Patient tolerated injection without complications    Given by: Clemens Catholic LPN (September 15, 2010 1:16 PM)  Orders Added: 1)  Depo- Medrol 80mg  [J1040] 2)  Admin of Therapeutic Inj  intramuscular or subcutaneous [96372] 3)  Est. Patient Level III [56433]

## 2010-10-18 NOTE — Progress Notes (Signed)
Summary: Still having gout flare  Phone Note Call from Patient Call back at Home Phone [phone]   Caller: Patient Call For: Nani Gasser MD Summary of Call: Knee and ankle swollen this morning and painful- can not hardly walk this morning. Is there any thing else he can do- he was supposed to return to work today but can not go- will need a note for work for today. But wanted to know if the therapy he is on is [place] work Initial call taken by: Kathlene November LPN,  September 14, 2010 8:25 AM  Follow-up for Phone Call        Can give work note. Did he start the prednisone?  Follow-up by: Nani Gasser MD,  September 14, 2010 9:06 AM  Additional Follow-up for Phone Call Additional follow up Details #1::        Yes he did start the prednisone Additional Follow-up by: Kathlene November LPN,  September 14, 2010 9:31 AM    Additional Follow-up for Phone Call Additional follow up Details #2::    I will call in something stronger but may take 24 hous for steroids to really kick in.  Follow-up by: Nani Gasser MD,  September 14, 2010 11:09 AM  Additional Follow-up for Phone Call Additional follow up Details #3:: Details for Additional Follow-up Action Taken: pt notified.called and canceled rx to [facility] Additional Follow-up by: Avon Gully CMA, Duncan Dull),  September 14, 2010 11:20 AM  New/Updated Medications: HYDROCODONE-ACETAMINOPHEN 5-500 MG TABS (HYDROCODONE-ACETAMINOPHEN) 1-2 tabs every 6 hours as needed Prescriptions: HYDROCODONE-ACETAMINOPHEN 5-500 MG TABS (HYDROCODONE-ACETAMINOPHEN) 1-2 tabs every 6 hours as needed  #12 x 0   Entered and Authorized by:   Nani Gasser MD   Signed by:   Nani Gasser MD on 09/14/2010   Method used:   Printed then faxed to ...       [facility] [address] (retail)       [address] [place], [place]  [address]       Ph: [phone] or [phone]       Fax: [phone]  RxID:   [id number]

## 2010-11-13 ENCOUNTER — Ambulatory Visit: Payer: Self-pay | Admitting: Family Medicine

## 2011-07-16 ENCOUNTER — Ambulatory Visit (HOSPITAL_COMMUNITY): Payer: Self-pay | Admitting: Psychiatry

## 2011-12-24 ENCOUNTER — Encounter: Payer: Self-pay | Admitting: *Deleted

## 2011-12-24 ENCOUNTER — Emergency Department
Admission: EM | Admit: 2011-12-24 | Discharge: 2011-12-24 | Disposition: A | Discharge status: Home / Self Care | Payer: BC Managed Care – PPO | Source: Home / Self Care | Attending: Family Medicine | Admitting: Family Medicine

## 2011-12-24 DIAGNOSIS — M109 Gout, unspecified: Secondary | ICD-10-CM

## 2011-12-24 MED ORDER — COLCHICINE 0.6 MG PO TABS: ORAL_TABLET | ORAL | Status: DC

## 2011-12-24 MED ORDER — HYDROCODONE-ACETAMINOPHEN 5-500 MG PO TABS: ORAL_TABLET | ORAL | Status: DC

## 2011-12-24 MED ORDER — METHYLPREDNISOLONE SODIUM SUCC 125 MG IJ SOLR
125.0000 mg | Freq: Once | INTRAMUSCULAR | Status: AC
  Administered 2011-12-24: 125 mg via INTRAMUSCULAR

## 2011-12-24 MED ORDER — PREDNISONE 10 MG PO TABS: ORAL_TABLET | ORAL | Status: DC

## 2011-12-24 NOTE — ED Notes (Signed)
Pt c/o RT knee pain, swelling and warm to touch x 2 days. He has a hx of gout. He took his last allopurinol on Sunday. He has taken IBF for pain.

## 2011-12-24 NOTE — ED Provider Notes (Signed)
History     CSN: [id number]  Arrival date & time 12/24/11  4098   First MD Initiated Contact with Patient 12/24/11 [phone]      Chief Complaint  Patient presents with  . Knee Pain      HPI Comments: Patient complains of 3 day history of recurrent typical episode of gout in his right knee.  The pain has not responded to ibuprofen.  He takes allopurinol but stopped it when his knee pain appeared.  He has used colchicine in the past.  No fevers, chills, and sweats   Patient is a 56 y.o. male presenting with knee pain. The history is provided by the patient.  Knee Pain This is a recurrent problem. Episode onset: 3 days ago. The problem occurs constantly. The problem has been gradually worsening. Pertinent negatives include no abdominal pain, no headaches and no shortness of breath. The symptoms are aggravated by walking and bending. The symptoms are relieved by nothing. Treatments tried: Ibuprofen.    Past Medical History  Diagnosis Date  . Gout     Past Surgical History  Procedure Date  . Knee surgery     Family History  Problem Relation Age of Onset  . Diabetes Mother   . Hypertension Mother   . Gout Mother     History  Substance Use Topics  . Smoking status: Former Games developer  . Smokeless tobacco: Not on file  . Alcohol Use: No      Review of Systems  Constitutional: Positive for chills. Negative for fever.  Respiratory: Negative for shortness of breath.   Gastrointestinal: Negative for abdominal pain.  Neurological: Negative for headaches.  All other systems reviewed and are negative.    Allergies  Codeine and Statins  Home Medications   Current Outpatient Rx  Name Route Sig Dispense Refill  . ALLOPURINOL 100 MG PO TABS Oral Take 100 mg by mouth daily.    . COLCHICINE 0.6 MG PO TABS  Take 2 tabs by mouth at first sign of gout, then one tab one hour later 3 tablet 1  . HYDROCODONE-ACETAMINOPHEN 5-500 MG PO TABS  Take one or two tabs by mouth at bedtime as  needed for pain 12 tablet 0  . PREDNISONE 10 MG PO TABS  Take 2 tabs by mouth today, then two tabs twice daily for three days, then one tab twice daily for 2 days, then 1 tab daily for two days.  Take PC 20 tablet 0    BP 142/68  Pulse 80  Temp(Src) 98.9 F (37.2 C) (Oral)  Resp 16  Ht 6' (1.829 m)  Wt 239 lb (108.41 kg)  BMI 32.41 kg/m2  SpO2 97%  Physical Exam  Nursing note and vitals reviewed. Constitutional: He is oriented to person, place, and time. He appears well-developed and well-nourished. No distress.       Patient is obese (BMI 32.5)  HENT:  Head: Normocephalic.  Nose: Nose normal.  Mouth/Throat: Oropharynx is clear and moist.  Eyes: Conjunctivae are normal. Pupils are equal, round, and reactive to light.  Cardiovascular: Normal rate and normal heart sounds.   Pulmonary/Chest: Effort normal and breath sounds normal.  Abdominal: Soft. There is no tenderness.  Musculoskeletal:       Right knee: He exhibits decreased range of motion and swelling. He exhibits no effusion, no ecchymosis, no deformity, no laceration, no erythema, normal alignment, no LCL laxity, normal patellar mobility and no bony tenderness. tenderness found.       Right  knee is slightly warm to touch.  Decreased range of motion.  Distal Neurovascular function is intact.   Lymphadenopathy:    He has no cervical adenopathy.  Neurological: He is alert and oriented to person, place, and time.  Skin: Skin is warm and dry. No rash noted.    ED Course  Procedures  none      1. Acute gout       MDM  Solumedrol 125mg  IM, then begin oral taper of prednisone.  Rx for Lortab at bedtime.  Ace wrap applied. Rx given for Colcrys for next episode gout. Increase fluid intake. Followup with Family Doctor if not improved in one week.         Lattie Haw, MD 12/26/11 1343

## 2011-12-24 NOTE — Discharge Instructions (Signed)
Increase fluid intake.   Gout Gout is an inflammatory condition (arthritis) caused by a buildup of uric acid crystals in the joints. Uric acid is a chemical that is normally present in the blood. Under some circumstances, uric acid can form into crystals in your joints. This causes joint redness, soreness, and swelling (inflammation). Repeat attacks are common. Over time, uric acid crystals can form into masses (tophi) near a joint, causing disfigurement. Gout is treatable and often preventable. CAUSES  The disease begins with elevated levels of uric acid in the blood. Uric acid is produced by your body when it breaks down a naturally found substance called purines. This also happens when you eat certain foods such as meats and fish. Causes of an elevated uric acid level include:  Being passed down from parent to child (heredity).   Diseases that cause increased uric acid production (obesity, psoriasis, some cancers).   Excessive alcohol use.   Diet, especially diets rich in meat and seafood.   Medicines, including certain cancer-fighting drugs (chemotherapy), diuretics, and aspirin.   Chronic kidney disease. The kidneys are no longer able to remove uric acid well.   Problems with metabolism.  Conditions strongly associated with gout include:  Obesity.   High blood pressure.   High cholesterol.   Diabetes.  Not everyone with elevated uric acid levels gets gout. It is not understood why some people get gout and others do not. Surgery, joint injury, and eating too much of certain foods are some of the factors that can lead to gout. SYMPTOMS   An attack of gout comes on quickly. It causes intense pain with redness, swelling, and warmth in a joint.   Fever can occur.   Often, only one joint is involved. Certain joints are more commonly involved:   Base of the big toe.   Knee.   Ankle.   Wrist.   Finger.  Without treatment, an attack usually goes away in a few days to  weeks. Between attacks, you usually will not have symptoms, which is different from many other forms of arthritis. DIAGNOSIS  Your caregiver will suspect gout based on your symptoms and exam. Removal of fluid from the joint (arthrocentesis) is done to check for uric acid crystals. Your caregiver will give you a medicine that numbs the area (local anesthetic) and use a needle to remove joint fluid for exam. Gout is confirmed when uric acid crystals are seen in joint fluid, using a special microscope. Sometimes, blood, urine, and X-ray tests are also used. TREATMENT  There are 2 phases to gout treatment: treating the sudden onset (acute) attack and preventing attacks (prophylaxis). Treatment of an Acute Attack  Medicines are used. These include anti-inflammatory medicines or steroid medicines.   An injection of steroid medicine into the affected joint is sometimes necessary.   The painful joint is rested. Movement can worsen the arthritis.   You may use warm or cold treatments on painful joints, depending which works best for you.   Discuss the use of coffee, vitamin C, or cherries with your caregiver. These may be helpful treatment options.  Treatment to Prevent Attacks After the acute attack subsides, your caregiver may advise prophylactic medicine. These medicines either help your kidneys eliminate uric acid from your body or decrease your uric acid production. You may need to stay on these medicines for a very long time. The early phase of treatment with prophylactic medicine can be associated with an increase in acute gout attacks. For this reason,   during the first few months of treatment, your caregiver may also advise you to take medicines usually used for acute gout treatment. Be sure you understand your caregiver's directions. You should also discuss dietary treatment with your caregiver. Certain foods such as meats and fish can increase uric acid levels. Other foods such as dairy can  decrease levels. Your caregiver can give you a list of foods to avoid. HOME CARE INSTRUCTIONS   Do not take aspirin to relieve pain. This raises uric acid levels.   Only take over-the-counter or prescription medicines for pain, discomfort, or fever as directed by your caregiver.   Rest the joint as much as possible. When in bed, keep sheets and blankets off painful areas.   Keep the affected joint raised (elevated).   Use crutches if the painful joint is in your leg.   Drink enough water and fluids to keep your urine clear or pale yellow. This helps your body get rid of uric acid. Do not drink alcoholic beverages. They slow the passage of uric acid.   Follow your caregiver's dietary instructions. Pay careful attention to the amount of protein you eat. Your daily diet should emphasize fruits, vegetables, whole grains, and fat-free or low-fat milk products.   Maintain a healthy body weight.  SEEK MEDICAL CARE IF:   You have an oral temperature above 102 F (38.9 C).   You develop diarrhea, vomiting, or any side effects from medicines.   You do not feel better in 24 hours, or you are getting worse.  SEEK IMMEDIATE MEDICAL CARE IF:   Your joint becomes suddenly more tender and you have:   Chills.   An oral temperature above 102 F (38.9 C), not controlled by medicine.  MAKE SURE YOU:   Understand these instructions.   Will watch your condition.   Will get help right away if you are not doing well or get worse.  Document Released: 08/30/2000 Document Revised: 08/22/2011 Document Reviewed: 12/11/2009 [employer] Patient Information 2012 [employer]. 

## 2012-04-16 ENCOUNTER — Encounter: Payer: Self-pay | Admitting: Emergency Medicine

## 2012-04-16 ENCOUNTER — Emergency Department
Admission: EM | Admit: 2012-04-16 | Discharge: 2012-04-16 | Disposition: A | Discharge status: Home / Self Care | Payer: BC Managed Care – PPO | Source: Home / Self Care

## 2012-04-16 DIAGNOSIS — M109 Gout, unspecified: Secondary | ICD-10-CM

## 2012-04-16 MED ORDER — METHYLPREDNISOLONE SODIUM SUCC 125 MG IJ SOLR
125.0000 mg | Freq: Once | INTRAMUSCULAR | Status: AC
  Administered 2012-04-16: 125 mg via INTRAMUSCULAR

## 2012-04-16 MED ORDER — HYDROCODONE-ACETAMINOPHEN 5-300 MG PO TABS: ORAL_TABLET | ORAL | Status: DC

## 2012-04-16 MED ORDER — PREDNISONE 10 MG PO TABS: ORAL_TABLET | ORAL | Status: DC

## 2012-04-16 NOTE — ED Provider Notes (Signed)
History     CSN: [id number]  Arrival date & time 04/16/12  1731   None     Chief Complaint  Patient presents with  . Gout     HPI Comments: Patient states that he "stubbed" his right great toe about one week ago.  The pain and swelling had resolved by the next day, but over the past 4 days he has had increasing pain, warmth, heat, and swelling around his right great toe and right ankle.  Patient is a 56 y.o. male presenting with lower extremity pain. The history is provided by the patient.  Foot Pain This is a recurrent problem. Episode onset: 4 days ago. The problem occurs constantly. The problem has been gradually worsening. Pertinent negatives include no chest pain, no abdominal pain, no headaches and no shortness of breath. The symptoms are aggravated by standing and walking. Nothing relieves the symptoms. Treatments tried: Ibuprofen. The treatment provided mild relief.    Past Medical History  Diagnosis Date  . Gout     Past Surgical History  Procedure Date  . Knee surgery     Family History  Problem Relation Age of Onset  . Diabetes Mother   . Hypertension Mother   . Gout Mother     History  Substance Use Topics  . Smoking status: Former Games developer  . Smokeless tobacco: Not on file  . Alcohol Use: No      Review of Systems  Respiratory: Negative for shortness of breath.   Cardiovascular: Negative for chest pain.  Gastrointestinal: Negative for abdominal pain.  Neurological: Negative for headaches.  All other systems reviewed and are negative.    Allergies  Codeine and Statins  Home Medications   Current Outpatient Rx  Name Route Sig Dispense Refill  . ALLOPURINOL 100 MG PO TABS Oral Take 100 mg by mouth daily.    . COLCHICINE 0.6 MG PO TABS  Take 2 tabs by mouth at first sign of gout, then one tab one hour later 3 tablet 1  . HYDROCODONE-ACETAMINOPHEN 5-500 MG PO TABS  Take one or two tabs by mouth at bedtime as needed for pain 12 tablet 0  .  HYDROCODONE-ACETAMINOPHEN 5-300 MG PO TABS  Take one tab by mouth at bedtime as needed for pain. 12 each 0  . PREDNISONE 10 MG PO TABS  Take 2 tabs by mouth twice daily for three days, then one tab twice daily for 2 days, then 1 tab daily for two days.  Take PC 18 tablet 0    BP 156/106  Pulse 69  Temp 97.5 F (36.4 C) (Oral)  Resp 16  Ht 6' (1.829 m)  Wt 237 lb (107.502 kg)  BMI 32.14 kg/m2  SpO2 99%  Physical Exam Nursing notes and Vital Signs reviewed. Appearance:  Patient appears stated age, and in no acute distress Eyes:  Pupils are equal, round, and reactive to light and accomodation.  Extraocular movement is intact.  Conjunctivae are not inflamed  Pharynx:  Normal Neck:  Supple.   No adenopathy Lungs:  Clear to auscultation.  Breath sounds are equal.  Heart:  Regular rate and rhythm without murmurs, rubs, or gallops.  Abdomen:  Nontender without masses or hepatosplenomegaly.  Bowel sounds are present.  No CVA or flank tenderness.  Extremities:  No edema.  No calf tenderness.  Right foot reveals tenderness and warmth over the first MTP joint.  Tenderness also present right ankle joint with decreased range of motion.  Distal Neurovascular function  is intact.  Skin:  No rash present.   ED Course  Procedures  none      1. Acute gouty arthritis        Note hypertension; patient not taking anti-hypertensive.    MDM  Solumedrol 125mg  IM.  Begin prednisone taper tomorrow.  Vicodin at bedtime for pain. Increase fluid intake.  Check BP in several days when pain has decreased.  Followup with your family doctor for blood pressure         Lattie Haw, MD 04/18/12 [phone]

## 2012-04-16 NOTE — ED Notes (Signed)
Rt great toe and ankle pain, red and swollen after stumping great toe x 1 week ago

## 2012-04-18 ENCOUNTER — Telehealth: Payer: Self-pay | Admitting: Family Medicine

## 2012-08-06 ENCOUNTER — Telehealth: Payer: Self-pay | Admitting: Family Medicine

## 2012-08-06 NOTE — Telephone Encounter (Signed)
Sounds good to me, I'll forward to Dr. Judie Petit

## 2012-08-06 NOTE — Telephone Encounter (Signed)
No problem.  Ok to change PCP in the header.

## 2012-08-06 NOTE — Telephone Encounter (Signed)
Patient is interested in switching to a male provider.  I have him set to see Dr. Ivan Anchors on Monday for his CPE.  Nothing against you Dr. Judie Petit, he just feels more comfortable with a man.  Is it ok for me to switch him?  Thanks

## 2012-08-10 ENCOUNTER — Ambulatory Visit (INDEPENDENT_AMBULATORY_CARE_PROVIDER_SITE_OTHER): Payer: BC Managed Care – PPO | Admitting: Family Medicine

## 2012-08-10 ENCOUNTER — Encounter: Payer: Self-pay | Admitting: Family Medicine

## 2012-08-10 VITALS — BP 147/100 | HR 75 | Temp 98.1°F | Ht 72.5 in | Wt 235.0 lb

## 2012-08-10 DIAGNOSIS — F329 Major depressive disorder, single episode, unspecified: Secondary | ICD-10-CM | POA: Insufficient documentation

## 2012-08-10 DIAGNOSIS — E785 Hyperlipidemia, unspecified: Secondary | ICD-10-CM

## 2012-08-10 DIAGNOSIS — Z0000 Encounter for general adult medical examination without abnormal findings: Secondary | ICD-10-CM

## 2012-08-10 DIAGNOSIS — R809 Proteinuria, unspecified: Secondary | ICD-10-CM

## 2012-08-10 DIAGNOSIS — I10 Essential (primary) hypertension: Secondary | ICD-10-CM

## 2012-08-10 DIAGNOSIS — Z1211 Encounter for screening for malignant neoplasm of colon: Secondary | ICD-10-CM

## 2012-08-10 DIAGNOSIS — M109 Gout, unspecified: Secondary | ICD-10-CM

## 2012-08-10 MED ORDER — PREDNISONE 20 MG PO TABS: ORAL_TABLET | ORAL | Status: AC

## 2012-08-10 MED ORDER — SERTRALINE HCL 50 MG PO TABS: 50.0000 mg | ORAL_TABLET | Freq: Every day | ORAL | Status: DC

## 2012-08-10 NOTE — Progress Notes (Signed)
CC: Bradley Harper is a 56 y.o. male is here for Annual Exam and Gout  Colonoscopy: He believes his last screening colonoscopy was 7 years ago, he's unsure what the results were, he's unsure about followup. I will place an order for a repeat screening colonoscopy since it's been greater than 5 years. Prostate: Discussed screening risks/beneifts with patient on 08/10/2012. He's unsure of prior PSA levels, he's positive no family history of prostate cancer. We'll be getting a PSA today  Influenza Vaccine: Declines on today's visit Pneumovax: Not indicated Td/Tdap: Td 12/14/2009  Subjective: HPI:  Patient presents for annual physical exam with updates of the following.  Gout: Continues to see Dr. Ancil Linsey, taking allopurinol 400 mg a day, he believes the gout flares are actually worse while he is on allopurinol. There been no adjustments to his allopurinol. He believes he is having a flare right now in the right foot. He states he cannot tolerate uloric.  He has cut out alcohol and high purine foods without much improvement of his gout flares which occur almost weekly to monthly.  Elevated blood pressure: His last blood pressure reading at Dr. Ancil Linsey was reportedly 117/70. He has not been on any anti-hypertensive medications  An outside physical for his job revealed 30+ proteinuria back in May that has not been worked up since.  Over the past 2 weeks have you been bothered by: - Little interest or pleasure in doing things: Yes - Feeling down depressed or hopeless: Yes He treats these feelings to his chronic gout pain and recently been laid off and having trouble finding work. Denies thoughts of wanting to harm himself or others. Has trouble getting to sleep at night these symptoms have been going on well over 6 weeks. Additionally has lost interest with going out with friends   Review of Systems - General ROS: negative for - chills, fever, night sweats, weight gain or weight loss Ophthalmic ROS:  negative for - decreased vision Psychological ROS: negative for - anxiety or paranoia ENT ROS: negative for - hearing change, nasal congestion, tinnitus or allergies Hematological and Lymphatic ROS: negative for - bleeding problems, bruising or swollen lymph nodes Breast ROS: negative Respiratory ROS: no cough, shortness of breath, or wheezing Cardiovascular ROS: no chest pain or dyspnea on exertion Gastrointestinal ROS: no abdominal pain, change in bowel habits, or black or bloody stools Genito-Urinary ROS: negative for - genital discharge, genital ulcers, incontinence or abnormal bleeding from genitals Musculoskeletal ROS: negative for - Weakness/fatigue Neurological ROS: negative for - headaches or memory loss Dermatological ROS: negative for lumps, mole changes, rash and skin lesion changes  Past Medical History  Diagnosis Date  . Gout      Family History  Problem Relation Age of Onset  . Diabetes Mother   . Hypertension Mother   . Gout Mother      History  Substance Use Topics  . Smoking status: Former Games developer  . Smokeless tobacco: Not on file  . Alcohol Use: No     Objective: Filed Vitals:   08/10/12 1344  BP: 147/100  Pulse: 75  Temp: 98.1 F (36.7 C)    General: No Acute Distress HEENT: Atraumatic, normocephalic, conjunctivae normal without scleral icterus.  No nasal discharge, hearing grossly intact, TMs with good landmarks bilaterally with no middle ear abnormalities, posterior pharynx clear without oral lesions. Neck: Supple, trachea midline, no cervical nor supraclavicular adenopathy. Pulmonary: Clear to auscultation bilaterally without wheezing, rhonchi, nor rales. Cardiac: Regular rate and rhythm.  No  murmurs, rubs, nor gallops. No peripheral edema.  2+ peripheral pulses bilaterally. Abdomen: Bowel sounds normal.  No masses.  Non-tender without rebound.  Negative Murphy's sign. GU: Penis without lesions.  Bilateral descended non-tender testicles without  palpable abnormal masses. MSK: Grossly intact, no signs of weakness.  Full strength throughout upper and lower extremities.  Full ROM in upper and lower extremities.  No midline spinal tenderness. Right MTP is mildly swollen and tender with moderate overlying erythema and Neuro: Gait unremarkable, CN II-XII grossly intact.  C5-C6 Reflex 2/4 Bilaterally, L4 Reflex 2/4 Bilaterally.  Cerebellar function intact. Skin: No rashes. Psych: Alert and oriented to person/place/time.  Thought process normal. No anxiety/restlessness  Assessment & Plan: Bradley Harper was seen today for annual exam and gout.  Diagnoses and associated orders for this visit:  Annual physical exam - Ambulatory referral to Gastroenterology  Hyperlipidemia - Lipid panel  Gout - Uric acid - predniSONE (DELTASONE) 20 MG tablet; Three tabs daily days 1-3, two tabs daily days 4-6, one tab daily days 7-9, half tab daily days 10-13.  Essential hypertension - PSA  Proteinuria - BASIC METABOLIC PANEL WITH GFR - Urinalysis, Routine w reflex microscopic  Depression - sertraline (ZOLOFT) 50 MG tablet; Take 1 tablet (50 mg total) by mouth daily.  Colon cancer screening - Ambulatory referral to Gastroenterology    Depression screen positive, further investigation reveals diagnosis of depression. He would like to start Zoloft. He will return in 3-4 weeks for reassessment and evaluation of response.  I believe he is having an acute gout flare, provided prednisone taper as he's already tried to days of colchicine.  He will be following up with Dr. Ancil Linsey to discuss changing meds.  Fasting lipid panel to follow his hyperlipidemia, basic metabolic panel and urinalysis for proteinuria, we'll use this to screen for diabetes, colonoscopy referral placed.  Healthy lifestyle options were discussed with the patient to promote general health and wellness, handout was given to reemphasize these topics.  He will followup in 3 weeks to  spend more time on a dressing chronic medical illnesses, he is open to the idea of starting antihypertensives if he still is in stage I or higher next visit/.  Return in about 3 weeks (around 08/31/2012).

## 2012-08-10 NOTE — Patient Instructions (Signed)
Probenecid tablets What is this medicine? PROBENECID (proe BEN e sid) helps to remove excess uric acid from the body. This medicine is used to prevent gouty attacks. It is also used to increase the amount of time that some antibiotics stay in the body. This medicine may be used for other purposes; ask your health care provider or pharmacist if you have questions. What should I tell my health care provider before I take this medicine? They need to know if you have any of these conditions: -acute gouty attack -blood disorders or disease -kidney disease, or kidney stones -recent radiation therapy -stomach ulcers -an unusual or allergic reaction to probenecid, sulfa drugs, other medicines, foods, dyes, or preservatives -pregnant or trying to get pregnant -breast-feeding How should I use this medicine? Take this medicine by mouth with a full glass of water. Follow the directions on the prescription label. Take your medicine at regular intervals. Do not take your medicine more often than directed. Do not stop taking except on your doctor's advice. Talk to your pediatrician regarding the use of this medicine in children. Special care may be needed. Do not use this medicine in children under 37 years old. Overdosage: If you think you have taken too much of this medicine contact a poison control center or emergency room at once. NOTE: This medicine is only for you. Do not share this medicine with others. What if I miss a dose? If you miss a dose, take it as soon as you can. If it is almost time for your next dose, take only that dose. Do not take double or extra doses. What may interact with this medicine?   Dr. Genelle Bal General Advice Following Your Complete Physical Exam  The Benefits of Regular Exercise: Unless you suffer from an uncontrolled cardiovascular condition, studies strongly suggest that regular exercise and physical activity will add to both the quality and length of your life.  The  World Health Organization recommends 150 minutes of moderate intensity aerobic activity every week.  This is best split over 3-4 days a week, and can be as simple as a brisk walk for just over 35 minutes "most days of the week".  This type of exercise has been shown to lower LDL-Cholesterol, lower average blood sugars, lower blood pressure, lower cardiovascular disease risk, improve memory, and increase one's overall sense of wellbeing.  The addition of anaerobic (or "strength training") exercises offers additional benefits including but not limited to increased metabolism, prevention of osteoporosis, and improved overall cholesterol levels.  How Can I Strive For A Low-Fat Diet?: Current guidelines recommend that 25-35 percent of your daily energy (food) intake should come from fats.  One might ask how can this be achieved without having to dissect each meal on a daily basis?  Switch to skim or 1% milk instead of whole milk.  Focus on lean meats such as ground [place], fresh fish, baked chicken, and lean cuts of beef as your source of dietary protein.  Consume less than 300mg /day of dietary cholesterol.  Limit trans fatty acid consumption primarily by limiting synthetic trans fats such as partially hydrogenated oils (Ex: fried fast foods).  Focus efforts on reducing your intake of "solid" fats (Ex: Butter).  Substitute olive or vegetable oil for solid fats where possible.  Moderation of Salt Intake: Provided you don't carry a diagnosis of congestive heart failure nor renal failure, I recommend a daily allowance of no more than 2300 mg of salt (sodium).  Keeping under this daily goal  is associated with a decreased risk of cardiovascular events, creeping above it can lead to elevated blood pressures and increases your risk of cardiovascular events.  Milligrams (mg) of salt is listed on all nutrition labels, and your daily intake can add up faster than you think.  Most canned and frozen dinners can pack  in over half your daily salt allowance in one meal.    Lifestyle Health Risks: Certain lifestyle choices carry specific health risks.  As you may already know, tobacco use has been associated with increasing one's risk of cardiovascular disease, pulmonary disease, numerous cancers, among many other issues.  What you may not know is that there are medications and nicotine replacement strategies that can more than double your chances of successfully quitting.  I would be thrilled to help manage your quitting strategy if you currently use tobacco products.  When it comes to alcohol use, I've yet to find an "ideal" daily allowance.  Provided an individual does not have a medical condition that is exacerbated by alcohol consumption, general guidelines determine "safe drinking" as no more than two standard drinks for a man or no more than one standard drink for a male per day.  However, much debate still exists on whether any amount of alcohol consumption is technically "safe".  My general advice, keep alcohol consumption to a minimum for general health promotion.  If you or others believe that alcohol, tobacco, or recreational drug use is interfering with your life, I would be happy to provide confidential counseling regarding treatment options.  General "Over The Counter" Nutrition Advice: Postmenopausal women should aim for a daily calcium intake of 1200 mg, however a significant portion of this might already be provided by diets including milk, yogurt, cheese, and other dairy products.  Vitamin D has been shown to help preserve bone density, prevent fatigue, and has even been shown to help reduce falls in the elderly.  Ensuring a daily intake of 800 Units of Vitamin D is a good place to start to enjoy the above benefits, we can easily check your Vitamin D level to see if you'd potentially benefit from supplementation beyond 800 Units a day.  Folic Acid intake should be of particular concern to women of  childbearing age.  Daily consumption of 400-800 mcg of Folic Acid is recommended to minimize the chance of spinal cord defects in a fetus should pregnancy occur.    For many adults, accidents still remain one of the most common culprits when it comes to cause of death.  Some of the simplest but most effective preventitive habits you can adopt include regular seatbelt use, proper helmet use, securing firearms, and regularly testing your smoke and carbon monoxide detectors.  Delphin Funes B. [facility] DO [facility] [address], [address] [place], [place] [address] Phone: [phone]

## 2012-08-11 ENCOUNTER — Encounter: Payer: Self-pay | Admitting: Family Medicine

## 2012-08-11 ENCOUNTER — Encounter: Payer: Self-pay | Admitting: Internal Medicine

## 2012-08-11 DIAGNOSIS — R972 Elevated prostate specific antigen [PSA]: Secondary | ICD-10-CM

## 2012-08-11 LAB — URINALYSIS, ROUTINE W REFLEX MICROSCOPIC
Bilirubin Urine: NEGATIVE
Glucose, UA: NEGATIVE mg/dL
Ketones, ur: NEGATIVE mg/dL
Nitrite: NEGATIVE
Protein, ur: NEGATIVE mg/dL
Urobilinogen, UA: 0.2 mg/dL (ref 0.0–1.0)

## 2012-08-11 LAB — BASIC METABOLIC PANEL WITH GFR
Creat: 1.04 mg/dL (ref 0.50–1.35)
Sodium: 142 mEq/L (ref 135–145)

## 2012-08-11 LAB — LIPID PANEL
Cholesterol: 182 mg/dL (ref 0–200)
HDL: 66 mg/dL (ref >39)
LDL Cholesterol: 86 mg/dL (ref 0–99)
Triglycerides: 152 mg/dL — ABNORMAL HIGH (ref <150)
VLDL: 30 mg/dL (ref 0–40)

## 2012-09-01 ENCOUNTER — Encounter: Payer: Self-pay | Admitting: Family Medicine

## 2012-09-01 ENCOUNTER — Ambulatory Visit (INDEPENDENT_AMBULATORY_CARE_PROVIDER_SITE_OTHER): Payer: BC Managed Care – PPO | Admitting: Family Medicine

## 2012-09-01 VITALS — BP 131/90 | HR 97 | Ht 72.5 in | Wt 228.0 lb

## 2012-09-01 DIAGNOSIS — F329 Major depressive disorder, single episode, unspecified: Secondary | ICD-10-CM

## 2012-09-01 DIAGNOSIS — F3289 Other specified depressive episodes: Secondary | ICD-10-CM

## 2012-09-01 DIAGNOSIS — B356 Tinea cruris: Secondary | ICD-10-CM

## 2012-09-01 DIAGNOSIS — R972 Elevated prostate specific antigen [PSA]: Secondary | ICD-10-CM

## 2012-09-01 MED ORDER — CLOTRIMAZOLE 1 % EX CREA: TOPICAL_CREAM | CUTANEOUS | Status: AC

## 2012-09-01 MED ORDER — CITALOPRAM HYDROBROMIDE 20 MG PO TABS: 20.0000 mg | ORAL_TABLET | Freq: Every day | ORAL | Status: DC

## 2012-09-01 NOTE — Progress Notes (Signed)
CC: Sender Bradley Harper is a 56 y.o. male is here for Depression   Subjective: HPI:  Patient presents for followup. He's picking up some jobs still doesn't have a full-time job.  Depression: Started Zoloft at his last visit has noticed trouble maintaining an erection. Sleep has not improved, he is able to stay asleep but is having trouble getting to sleep on random nights for no particular reason. He still has little interest in socializing with old friends and family. He has no thoughts of wanting to harm herself or others. Relationship with his wife is steady without concerns per his report. No change in appetite. No unintentional weight loss or gain. Denies thoughts of sadness. Reports lack of motivation for things other than finding work.  PSA was 5 at his last visit, he's unsure what his last values have been but 2 years ago is a 2. 1.  No family history of prostate cancer. No new joint muscle or bone pain. No unintentional weight loss. Denies frequent urination, sensation of incomplete voiding, weak stream, shrinking urinate.  Complains of itching in the groin present for weeks. Comes and goes but is always present. Seems to get worse when skin is dry, often gets flaking. No interventions as of yet. Accompanied with burning and itching at its worst. Relief with scratching only temporarily. Denies skin changes otherwise.   Review Of Systems Outlined In HPI  Past Medical History  Diagnosis Date  . Gout      Family History  Problem Relation Age of Onset  . Diabetes Mother   . Hypertension Mother   . Gout Mother      History  Substance Use Topics  . Smoking status: Former Games developer  . Smokeless tobacco: Not on file  . Alcohol Use: No     Objective: Filed Vitals:   09/01/12 1339  BP: 131/90  Pulse: 97    General: Alert and Oriented, No Acute Distress HEENT: Pupils equal, round, reactive to light. Conjunctivae clear.   Moist mucous membranes, pharynx without inflammation nor  lesions.  Neck supple without palpable lymphadenopathy nor abnormal masses. Lungs: Clear to auscultation bilaterally, no wheezing/ronchi/rales.  Comfortable work of breathing. Good air movement. Cardiac: Regular rate and rhythm. Normal S1/S2.  No murmurs, rubs, nor gallops.   Abdomen: Soft nontender to palpation Genitourinary: Normal testes and penis, mild erythema dryness and flaking of both groin folds. Extremities: No peripheral edema.  Strong peripheral pulses.  Mental Status: No depression, anxiety, nor agitation. Skin: Warm and dry.  Assessment & Plan: Bradley Harper was seen today for depression.  Diagnoses and associated orders for this visit:  Elevated psa repeat june 2014  Tinea cruris - clotrimazole (LOTRIMIN) 1 % cream; Apply to groin fold twice a day for up to four weeks, applying up to two weeks after resolution of symptoms.  Depression - citalopram (CELEXA) 20 MG tablet; Take 1 tablet (20 mg total) by mouth daily.    Depression: Not controlled, we'll switch to Celexa and redress in 4 weeks Elevated PSA: Discussed risks and benefits of sending to urology for consideration of biopsy, he'd prefer to repeat PSA in 6-12 months Tinea cruris: Start clotrimazole twice a day  Return in about 4 weeks (around 09/29/2012).

## 2012-09-02 ENCOUNTER — Ambulatory Visit (AMBULATORY_SURGERY_CENTER): Payer: BC Managed Care – PPO | Admitting: *Deleted

## 2012-09-02 ENCOUNTER — Encounter: Payer: Self-pay | Admitting: Internal Medicine

## 2012-09-02 VITALS — Ht 72.0 in | Wt 232.0 lb

## 2012-09-02 DIAGNOSIS — Z1211 Encounter for screening for malignant neoplasm of colon: Secondary | ICD-10-CM

## 2012-09-02 MED ORDER — MOVIPREP 100 G PO SOLR: ORAL | Status: DC

## 2012-09-11 ENCOUNTER — Ambulatory Visit (INDEPENDENT_AMBULATORY_CARE_PROVIDER_SITE_OTHER): Payer: BC Managed Care – PPO | Admitting: Family Medicine

## 2012-09-11 ENCOUNTER — Encounter: Payer: Self-pay | Admitting: Family Medicine

## 2012-09-11 VITALS — BP 145/95 | HR 72 | Wt 238.0 lb

## 2012-09-11 DIAGNOSIS — L219 Seborrheic dermatitis, unspecified: Secondary | ICD-10-CM

## 2012-09-11 DIAGNOSIS — M76899 Other specified enthesopathies of unspecified lower limb, excluding foot: Secondary | ICD-10-CM

## 2012-09-11 DIAGNOSIS — M7050 Other bursitis of knee, unspecified knee: Secondary | ICD-10-CM

## 2012-09-11 MED ORDER — SELENIUM SULFIDE 2.25 % EX FOAM: CUTANEOUS | Status: DC

## 2012-09-11 MED ORDER — PREDNISONE 20 MG PO TABS: ORAL_TABLET | ORAL | Status: AC

## 2012-09-11 NOTE — Progress Notes (Signed)
CC: Bradley Harper is a 56 y.o. male is here for left knee pain   Subjective: HPI:  Patient reports 24 hours of acute onset left knee pain. Pain is reproduced with movement of the knee however he reports full range of motion and strength since the onset of pain. Pain is moderate to severe in severity. No pain at rest and last an object is present on the site of pain. It is described as"gout pain". No interventions as of yet. He has had identical issues in the past that responded quickly to prednisone. He denies any change in his gout medications nor diet or alcohol intake. Other than that described above nothing makes the pain better or worse He denies fevers, chills, fatigue, nausea, vomiting, joint swelling, joint redness, inability to bear weight, nor trauma.  He complains of flaking above his eyebrows it comes and goes. It is been there for years. He is unsure of what makes it better or worse. He cannot predict when it's going to occur when it occurs it seems to last for a week and results spontaneously. It is associated with itching on his brow. He notes that when this flaking occurs he also has itching on his back. He denies any other skin lesions or skin changes. He denies any personal care products used on the face at the site of the flaking. He denies any redness of the skin nor ocular complaints   Review Of Systems Outlined In HPI  Past Medical History  Diagnosis Date  . Gout   . Depression   . Asthma     as child only     Family History  Problem Relation Age of Onset  . Diabetes Mother   . Hypertension Mother   . Gout Mother   . Colon cancer Neg Hx      History  Substance Use Topics  . Smoking status: Former Games developer  . Smokeless tobacco: Never Used  . Alcohol Use: Yes     Comment: occ. glass wine     Objective: Filed Vitals:   09/11/12 0951  BP: 145/95  Pulse: 72    General: Alert and Oriented, No Acute Distress HEENT: Pupils equal, round, reactive to light.  Conjunctivae clear.  Moist mucous membranes  Lungs: Clear and comfortable work of breathing Cardiac: Regular rate and rhythm.  Extremities: No peripheral edema.  Strong peripheral pulses. There is moderate swelling just superior to the left tibial tubercle which is painful with palpation and resisted knee extension. There is full range of motion strength in the left knee. There is no swelling nor tenderness of the left knee joint. Mental Status: No depression, anxiety, nor agitation. Skin: Warm and dry. There is mild flaking of the eyebrows bilaterally  Assessment & Plan: Bradley Harper was seen today for left knee pain.  Diagnoses and associated orders for this visit:  Patellar bursitis - predniSONE (DELTASONE) 20 MG tablet; Three tabs daily days 1-3, two tabs daily days 4-6, one tab daily days 7-9, half tab daily days 10-13.  Seborrheic dermatitis - Selenium Sulfide 2.25 % FOAM; In shower apply to scalp and back twice a week for two weeks then only once a week as necessary.    Patellar bursitis: Discussed rest, icing, and may start prednisone taper but I told him I don't think that this is gout per se. Seborrheic dermatitis: Start [facility] and use as needed in the future He is asked me to take over gout management, it's too expensive for him to  see Dr. Ancil Linsey. I've asked him to return for visit dedicated to possible options of uloric or probenecid as even on colcrys he was getting gout flares caused by allopurinol Use. Return in about 4 weeks (around 10/09/2012).

## 2012-09-21 ENCOUNTER — Encounter: Payer: BC Managed Care – PPO | Admitting: Internal Medicine

## 2012-09-30 ENCOUNTER — Ambulatory Visit: Payer: BC Managed Care – PPO | Admitting: Family Medicine

## 2012-10-16 ENCOUNTER — Ambulatory Visit (INDEPENDENT_AMBULATORY_CARE_PROVIDER_SITE_OTHER): Payer: Managed Care, Other (non HMO) | Admitting: Family Medicine

## 2012-10-16 ENCOUNTER — Encounter: Payer: Self-pay | Admitting: Family Medicine

## 2012-10-16 VITALS — BP 154/103 | HR 65 | Ht 72.05 in | Wt 237.0 lb

## 2012-10-16 DIAGNOSIS — F329 Major depressive disorder, single episode, unspecified: Secondary | ICD-10-CM

## 2012-10-16 DIAGNOSIS — I10 Essential (primary) hypertension: Secondary | ICD-10-CM

## 2012-10-16 DIAGNOSIS — M109 Gout, unspecified: Secondary | ICD-10-CM
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
## 2012-10-19 ENCOUNTER — Telehealth: Payer: Self-pay | Admitting: Family Medicine

## 2012-10-19 ENCOUNTER — Other Ambulatory Visit: Payer: Self-pay | Admitting: Family Medicine

## 2012-10-19 NOTE — Telephone Encounter (Signed)
Bradley Harper did provide a urine specimen. He turned it in this am at about 9. They did provide him with 24 hour urine container

## 2012-10-19 NOTE — Telephone Encounter (Signed)
Left message to call back  

## 2012-10-19 NOTE — Telephone Encounter (Signed)
Sue Lush, If you have a moment, can you please call mr. Bradley Harper and ask him if the lab gave him anything to collect urine with as we discussed checking the uric acid in his urine. I'm worried that the lab canceled this order without contacting any of [place].

## 2012-10-21 ENCOUNTER — Telehealth: Payer: Self-pay | Admitting: Family Medicine

## 2012-10-21 DIAGNOSIS — M109 Gout, unspecified: Secondary | ICD-10-CM

## 2012-10-21 MED ORDER — PROBENECID 500 MG PO TABS: ORAL_TABLET | ORAL | Status: DC

## 2012-10-21 NOTE — Telephone Encounter (Signed)
Sue Lush, Will you please thank Mr. Bradley Harper for putting up with that 24 hour collection I asked him to do.  His results show that he may benefit from a medication called probenecid that we discussed, which will help his kidneys remove uric acid.  I[facility] sent in a [facility] to [facility] [address]. It would be wise to recheck his uric aid in about a month.

## 2012-10-21 NOTE — Telephone Encounter (Signed)
Left message on pt's vm with results

## 2012-11-11 ENCOUNTER — Encounter: Payer: BC Managed Care – PPO | Admitting: Internal Medicine

## 2012-11-16 ENCOUNTER — Ambulatory Visit (INDEPENDENT_AMBULATORY_CARE_PROVIDER_SITE_OTHER): Payer: Managed Care, Other (non HMO) | Admitting: Sports Medicine

## 2012-11-16 ENCOUNTER — Ambulatory Visit (INDEPENDENT_AMBULATORY_CARE_PROVIDER_SITE_OTHER): Payer: Managed Care, Other (non HMO) | Admitting: Family Medicine

## 2012-11-16 ENCOUNTER — Encounter: Payer: Self-pay | Admitting: Family Medicine

## 2012-11-16 VITALS — BP 146/100 | HR 67 | Wt 238.0 lb

## 2012-11-16 DIAGNOSIS — D179 Benign lipomatous neoplasm, unspecified: Secondary | ICD-10-CM

## 2012-11-16 DIAGNOSIS — M7042 Prepatellar bursitis, left knee: Secondary | ICD-10-CM

## 2012-11-16 DIAGNOSIS — M7040 Prepatellar bursitis, unspecified knee: Secondary | ICD-10-CM

## 2012-11-16 DIAGNOSIS — M7052 Other bursitis of knee, left knee: Secondary | ICD-10-CM

## 2012-11-16 DIAGNOSIS — M109 Gout, unspecified: Secondary | ICD-10-CM

## 2012-11-16 DIAGNOSIS — M76899 Other specified enthesopathies of unspecified lower limb, excluding foot: Secondary | ICD-10-CM

## 2012-11-16 DIAGNOSIS — M7050 Other bursitis of knee, unspecified knee: Secondary | ICD-10-CM | POA: Insufficient documentation

## 2012-11-16 DIAGNOSIS — I10 Essential (primary) hypertension: Secondary | ICD-10-CM | POA: Insufficient documentation

## 2012-11-16 MED ORDER — INDOMETHACIN 50 MG PO CAPS: 50.0000 mg | ORAL_CAPSULE | Freq: Three times a day (TID) | ORAL | Status: DC

## 2012-11-16 MED ORDER — LISINOPRIL 20 MG PO TABS: 20.0000 mg | ORAL_TABLET | Freq: Every day | ORAL | Status: DC

## 2012-11-16 MED ORDER — COLCHICINE 0.6 MG PO TABS: ORAL_TABLET | ORAL | Status: DC

## 2012-11-16 NOTE — Patient Instructions (Addendum)
Lipoma  A lipoma is a noncancerous (benign) tumor composed of fat cells. They are usually found under the skin (subcutaneous). A lipoma may occur in any tissue of the body that contains fat. Common areas for lipomas to appear include the back, shoulders, buttocks, and thighs. Lipomas are a very common soft tissue growth. They are soft and grow slowly. Most problems caused by a lipoma depend on where it is growing.  DIAGNOSIS   A lipoma can be diagnosed with a physical exam. These tumors rarely become cancerous, but radiographic studies can help determine this for certain. Studies used may include:   Computerized X-ray scans (CT or CAT scan).   Computerized magnetic scans (MRI).  TREATMENT   Small lipomas that are not causing problems may be watched. If a lipoma continues to enlarge or causes problems, removal is often the best treatment. Lipomas can also be removed to improve appearance. Surgery is done to remove the fatty cells and the surrounding capsule. Most often, this is done with medicine that numbs the area (local anesthetic). The removed tissue is examined under a microscope to make sure it is not cancerous. Keep all follow-up appointments with your caregiver.  SEEK MEDICAL CARE IF:    The lipoma becomes larger or hard.   The lipoma becomes painful, red, or increasingly swollen. These could be signs of infection or a more serious condition.  Document Released: 08/23/2002 Document Revised: 11/25/2011 Document Reviewed: 02/02/2010  [employer] Patient Information 2013 [employer].

## 2012-11-16 NOTE — Progress Notes (Signed)
  Subjective:    I'm seeing this patient as a consultation for:  Dr. Ivan Anchors  CC: Left knee pain  HPI: This is a very pleasant 57 year old male with a history of gout with intolerances to allopurinol and Uloric who is currently being treated with probenecid, who comes in with a several year history of intermittent and recurrent pain and swelling he localizes over his left tibial tuberosity. He also recalls episodes where he has swelling over the elbows, as well as great pain and swelling in his first metatarsophalangeal joints.  Symptoms are improving, and he suspects this will be resolved in a day, as has happened in the past.  He denies any knee injuries in the past.  Past medical history, Surgical history, Family history not pertinant except as noted below, Social history, Allergies, and medications have been entered into the medical record, reviewed, and no changes needed.   Review of Systems: No headache, visual changes, nausea, vomiting, diarrhea, constipation, dizziness, abdominal pain, skin rash, fevers, chills, night sweats, weight loss, swollen lymph nodes, body aches, joint swelling, muscle aches, chest pain, shortness of breath, mood changes, visual or auditory hallucinations.   Objective:   General: Well Developed, well nourished, and in no acute distress.  Neuro/Psych: Alert and oriented x3, extra-ocular muscles intact, able to move all 4 extremities, sensation grossly intact. Skin: Warm and dry, no rashes noted.  Respiratory: Not using accessory muscles, speaking in full sentences, trachea midline.  Cardiovascular: Pulses palpable, no extremity edema. Abdomen: Does not appear distended. Left Knee: Fullness visible over tibial tuberosity. Tenderness to palpation with mild fluctuance over tibial tuberosity. ROM full in flexion and extension and lower leg rotation. Ligaments with solid consistent endpoints including ACL, PCL, LCL, MCL. Negative Mcmurray's, Apley's, and  Thessalonian tests. Non painful patellar compression. Patellar glide without crepitus. Patellar and quadriceps tendons unremarkable. Hamstring and quadriceps strength is normal.   Procedure: Limited Diagnostic Ultrasound Device: GE Logiq E  The tibial tubercle and patellar tendon were imaged in long, transverse, as well as LOGIQ views.  There was soft tissue swelling as well as a compressible, mobile hypoechoic but collapsed structure with a thick wall over the tibial tubercle.  There were hyperechoic calcifications with acoustic shadowing, as well as disruption in the patellar tendon distally at the insertion. Images permanently stored and available for review in the ultrasound unit.  Impression: Likely superficial infrapatellar bursitis with calcific distal patellar tendonitis.  Impression and Recommendations:   This case required medical decision making of moderate complexity.

## 2012-11-16 NOTE — Progress Notes (Signed)
CC: Bradley Harper is a 57 y.o. male is here for Hypertension and Gout   Subjective: HPI:  Patient presents  Due to concerns of hypertension.   blood pressure at GI office was 140/90. No other outside blood pressures to report. He has had elevated blood pressures in the stage I range twice in my office since I met him. He denies history of taking antihypertensives. He tries to stay active formerly working out 2 times a week. Tries to monitor his salt intake unable to quantify how much he is consuming during the day. Denies motor or sensory disturbances, chest pain, shortness of breath, orthopnea, peripheral edema.   Followup gout: Reports taking probenecid twice a day and had mild improvement of frequency and severity of gout flares. There's some confusion where he stopped taking probenecid do to what he believes is a gout flare in his left knee.  Patient complains of a mass behind the right knee. It has been present there for years. The size of it comes and goes without any predictability. It is painless. Nothing seems to make the mass larger or smaller. He denies trauma to the right knee, weakness, skin changes, nor motor or sensory disturbances in the right knee. He denies any right knee pain whatsoever.     Review Of Systems Outlined In HPI  Past Medical History  Diagnosis Date  . Gout   . Depression   . Asthma     as child only     Family History  Problem Relation Age of Onset  . Diabetes Mother   . Hypertension Mother   . Gout Mother   . Colon cancer Neg Hx      History  Substance Use Topics  . Smoking status: Former Games developer  . Smokeless tobacco: Never Used  . Alcohol Use: Yes     Comment: occ. glass wine     Objective: Filed Vitals:   11/16/12 1142  BP: 146/100  Pulse: 67    General: Alert and Oriented, No Acute Distress HEENT: Pupils equal, round, reactive to light. Conjunctivae clear.  Moist mucous membranes Lungs: Clear to auscultation bilaterally, no  wheezing/ronchi/rales.  Comfortable work of breathing. Good air movement. Cardiac: Regular rate and rhythm. Normal S1/S2.  No murmurs, rubs, nor gallops.   Extremities: No peripheral edema.  Strong peripheral pulses.  There is mild erythema and with moderate swelling just below the left patella which is somewhat tender to palpation. There is a nontender 1 cm mass in the medial popliteal space which is nonpulsatile. Mental Status: No depression, anxiety, nor agitation. Skin: Warm and dry.  Assessment & Plan: Quintarius was seen today for hypertension and gout.  Diagnoses and associated orders for this visit:  Essential hypertension, benign - lisinopril (PRINIVIL,ZESTRIL) 20 MG tablet; Take 1 tablet (20 mg total) by mouth daily.  Gout - indomethacin (INDOCIN) 50 MG capsule; Take 1 capsule (50 mg total) by mouth 3 (three) times daily with meals. As needed for gout. - colchicine 0.6 MG tablet; Take 2 tabs by mouth at first sign of gout, then one tab one hour later - probenecid (BENEMID) 500 MG tablet; Half a tablet twice a day (readdress dosing at follow up visits).  Lipoma  Prepatellar bursitis, left    Essential hypertension: Discussed diagnosis with the patient and will start lisinopril. Blood pressure recheck in one week. Discussed benefits of low sodium diet and routine physical activity Gout: Uncontrolled, I like him to restart probenecid and to work up to a full  tablet twice a day.   prepatellar bursitis: I have placed a referral to our sports medicine specialist Dr. Karie Schwalbe for further evaluation. Lipoma: Discussed benign nature of this and will follow clinically  Return in about 4 weeks (around 12/14/2012), or Dr. Karie Schwalbe sports medicine.

## 2012-11-16 NOTE — Assessment & Plan Note (Addendum)
It sounds as though Charlies is having recurrent episodes of bursitis in multiple locations including the infrapatellar bursa, olecranon bursa, these can be related to gout. I do recommend that he be treated aggressively to a uric acid level of 5 if possible. He should continue NSAIDs and colcrys. For episodes of monoarticular gout I do recommend that he come in for urgent interventional treatment as this is the treatment of choice for monoarticular gouty arthritis (and gouty bursitis). If his infrapatellar bursa does swell up significantly he will come back for interventional aspiration and injection.

## 2012-12-11 ENCOUNTER — Encounter: Payer: Self-pay | Admitting: Family Medicine

## 2013-01-18 ENCOUNTER — Other Ambulatory Visit: Payer: Self-pay | Admitting: Family Medicine

## 2013-02-18 ENCOUNTER — Other Ambulatory Visit: Payer: Self-pay | Admitting: Family Medicine

## 2013-02-18 DIAGNOSIS — M109 Gout, unspecified: Secondary | ICD-10-CM

## 2013-05-07 ENCOUNTER — Other Ambulatory Visit: Payer: Self-pay | Admitting: Family Medicine

## 2013-05-19 ENCOUNTER — Other Ambulatory Visit: Payer: Self-pay | Admitting: Family Medicine

## 2013-07-08 ENCOUNTER — Other Ambulatory Visit: Payer: Self-pay | Admitting: Family Medicine

## 2013-07-23 ENCOUNTER — Encounter: Payer: Self-pay | Admitting: Family Medicine

## 2013-07-23 ENCOUNTER — Ambulatory Visit (INDEPENDENT_AMBULATORY_CARE_PROVIDER_SITE_OTHER): Payer: Managed Care, Other (non HMO) | Admitting: Family Medicine

## 2013-07-23 VITALS — BP 136/82 | HR 118 | Wt 207.0 lb

## 2013-07-23 DIAGNOSIS — M109 Gout, unspecified: Secondary | ICD-10-CM

## 2013-07-23 MED ORDER — METHYLPREDNISOLONE SODIUM SUCC 125 MG IJ SOLR
125.0000 mg | Freq: Once | INTRAMUSCULAR | Status: AC
  Administered 2013-07-23: 125 mg via INTRAMUSCULAR

## 2013-07-23 MED ORDER — PROBENECID 500 MG PO TABS: ORAL_TABLET | ORAL | Status: DC

## 2013-07-23 MED ORDER — PREDNISONE 20 MG PO TABS: ORAL_TABLET | ORAL | Status: AC

## 2013-07-23 NOTE — Progress Notes (Signed)
CC: Bradley Harper is a 57 y.o. male is here for gout flare   Subjective: HPI:  Patient returns with first gout flare in 6 months. He had run out of probenecid last month. He complains of pain just below the patella on the right knee also in the right big toe, and the right forefoot. Pain is described as moderate at rest severe with weightbearing. Denies any trauma or recent over exertion. Locations of pain are slightly swollen, red, and warm.  No recent change in diet other than he is been a vegan since I saw him last intentionally losing 40 pounds with diet and exercise. No recent shellfish, or alcohol use. Denies hyperlipidemia was recently. Denies rapid heartbeat, fevers, chills, nausea, nor flushing. Tried indomethacin and colcrys at home without much benefit   Review Of Systems Outlined In HPI  Past Medical History  Diagnosis Date  . Gout   . Depression   . Asthma     as child only     Family History  Problem Relation Age of Onset  . Diabetes Mother   . Hypertension Mother   . Gout Mother   . Colon cancer Neg Hx      History  Substance Use Topics  . Smoking status: Former Games developer  . Smokeless tobacco: Never Used  . Alcohol Use: Yes     Comment: occ. glass wine     Objective: Filed Vitals:   07/23/13 1116  BP: 136/82  Pulse: 118    General: Alert and Oriented, No Acute Distress HEENT: Pupils equal, round, reactive to light. Conjunctivae clear.  Moist membranes Lungs: Clear comfortable work of breathing Cardiac: Regular rate and rhythm. Normal S1/S2.  No murmurs, rubs, nor gallops.   Extremities: Mild swelling of the right tibial tuberosity is also warm and slightly erythematous, moderate swelling at the base of the first metatarsal on the right foot with warmth and erythema, additional mild swelling with warmth and erythema at the base of the right great toe Mental Status: No depression, anxiety, nor agitation. Skin: Warm and dry.  Assessment &  Plan: [name] was seen today for gout flare.  Diagnoses and associated orders for this visit:  Gout - probenecid (BENEMID) 500 MG tablet; Half a tablet twice a day. - predniSONE (DELTASONE) 20 MG tablet; Three tabs daily days 1-3, two tabs daily days 4-6, one tab daily days 7-9, half tab daily days 10-13.    Suspect gout flare secondary to stopping probenecid, will receive Solu-Medrol today and then start prednisone taper. Restart probenecid.  Return if symptoms worsen or fail to improve.

## 2013-07-23 NOTE — Addendum Note (Signed)
Addended by: Avon Gully C on: 07/23/2013 11:42 AM   Modules accepted: Orders

## 2013-08-23 ENCOUNTER — Encounter: Payer: Self-pay | Admitting: Family Medicine

## 2013-09-28 DIAGNOSIS — C61 Malignant neoplasm of prostate: Secondary | ICD-10-CM

## 2013-09-28 HISTORY — PX: PROSTATE BIOPSY: SHX241

## 2013-09-28 HISTORY — DX: Malignant neoplasm of prostate: C61

## 2013-11-21 ENCOUNTER — Other Ambulatory Visit: Payer: Self-pay | Admitting: Family Medicine

## 2014-04-08 ENCOUNTER — Encounter: Payer: Self-pay | Admitting: Radiation Oncology

## 2014-04-08 NOTE — Progress Notes (Signed)
GU Location of Tumor / Histology: prostate  If Prostate Cancer, Gleason Score is (4 + 3) and PSA is (8.8 on 08/08/13)  Bradley Harper presented 8 months ago with signs/symptoms of: elevated PSA, slow stream, microscopic hemtauria  Biopsies of prostate (if applicable) revealed:  6/60/63  Volume 18.8 gm   Past/Anticipated interventions by urology, if any: biopsy, second opinion  Past/Anticipated interventions by medical oncology, if any: none  Weight changes, if any:   Bowel/Bladder complaints, if any:   Nausea/Vomiting, if any:   Pain issues, if any:    SAFETY ISSUES:  Prior radiation? no  Pacemaker/ICD? no  Possible current pregnancy? na  Is the patient on methotrexate? no  Current Complaints / other details:  Married Uncle with hx prostate cancer.

## 2014-04-13 ENCOUNTER — Ambulatory Visit
Admission: RE | Admit: 2014-04-13 | Discharge: 2014-04-13 | Disposition: A | Discharge status: Home / Self Care | Payer: Managed Care, Other (non HMO) | Source: Ambulatory Visit | Attending: Radiation Oncology | Admitting: Radiation Oncology

## 2014-04-13 ENCOUNTER — Ambulatory Visit: Payer: Managed Care, Other (non HMO)

## 2014-04-13 HISTORY — DX: Heartburn: R12

## 2014-04-13 HISTORY — DX: Malignant neoplasm of prostate: C61

## 2014-05-26 ENCOUNTER — Encounter: Payer: Self-pay | Admitting: Emergency Medicine

## 2014-05-26 ENCOUNTER — Emergency Department (INDEPENDENT_AMBULATORY_CARE_PROVIDER_SITE_OTHER)
Admission: EM | Admit: 2014-05-26 | Discharge: 2014-05-26 | Disposition: A | Discharge status: Home / Self Care | Payer: Managed Care, Other (non HMO) | Source: Home / Self Care | Attending: Emergency Medicine | Admitting: Emergency Medicine

## 2014-05-26 DIAGNOSIS — M7021 Olecranon bursitis, right elbow: Secondary | ICD-10-CM

## 2014-05-26 DIAGNOSIS — M10021 Idiopathic gout, right elbow: Secondary | ICD-10-CM

## 2014-05-26 DIAGNOSIS — M109 Gout, unspecified: Secondary | ICD-10-CM

## 2014-05-26 DIAGNOSIS — M7020 Olecranon bursitis, unspecified elbow: Secondary | ICD-10-CM

## 2014-05-26 HISTORY — DX: Essential (primary) hypertension: I10

## 2014-05-26 MED ORDER — METHYLPREDNISOLONE SODIUM SUCC 125 MG IJ SOLR
125.0000 mg | INTRAMUSCULAR | Status: AC
  Administered 2014-05-26: 125 mg via INTRAMUSCULAR

## 2014-05-26 MED ORDER — PREDNISONE (PAK) 10 MG PO TABS: ORAL_TABLET | ORAL | Status: DC

## 2014-05-26 NOTE — ED Provider Notes (Signed)
CSN: [id number]     Arrival date & time 05/26/14  0803 History   First MD Initiated Contact with Patient 05/26/14 [phone]     Chief Complaint  Patient presents with  . Joint Swelling  . Elbow Pain    HPI Chief complaint of progressive right posterior elbow pain and swelling x1 day. Pain is 4/10, throbbing. Minimal radiation to proximal forearm. Denies fever or chills, nausea vomiting chest pain or shortness of breath.  Does not recall any trauma or overuse of the right elbow.  +Established history of gout.  Was last seen by Dr. Ileene Rubens November 2014 for gout flareup. At that time, was treated with probenecid and twice a day, Solu-Medrol IM and by mouth prednisone burst. He states he is since switched PCPs to Dr. Joetta Manners at [facility]. By his report, he had been taking allopurinol 100 mg twice daily and prednisone 5 mg twice daily as regular medication, but he felt that cause side effects and he states his PCP DC'd these about a month ago.--He states his PCP restarted probenecid 500 mg twice a day and colchicine 6 mg daily prn about a week ago. History of prostate cancer diagnosed earlier this year, and he'll be starting radiation therapy.  Past Medical History  Diagnosis Date  . Gout   . Depression   . Asthma     as child only  . Heartburn   . Prostate cancer 09/28/13    Gleason 4+3=7, volume 18.8 gm  . Hypertension    Past Surgical History  Procedure Laterality Date  . Knee surgery    . Colonoscopy  11-10-2002  . Prostate biopsy  09/28/13    Gleason 7, volume 18.8 gm   Family History  Problem Relation Age of Onset  . Diabetes Mother   . Hypertension Mother   . Gout Mother   . Heart disease Mother   . Kidney disease Mother   . Colon cancer Neg Hx   . Diabetes Father    History  Substance Use Topics  . Smoking status: Former Smoker -- 0.50 packs/day for 5 years    Quit date: 09/16/2006  . Smokeless tobacco: Never Used  . Alcohol Use: Yes     Comment: occ. glass  wine    Review of Systems  Constitutional: Negative for fever and chills.  Respiratory: Negative for shortness of breath.   Cardiovascular: Negative for chest pain.  Gastrointestinal: Negative for abdominal pain.  Neurological: Negative for seizures and numbness.  All other systems reviewed and are negative.   Allergies  Statins and Codeine  Home Medications   Prior to Admission medications   Medication Sig Start Date End Date Taking? Authorizing Provider  citalopram (CELEXA) 20 MG tablet Take 1 tablet (20 mg total) by mouth daily. 09/01/12 09/01/13  Sean Hommel, DO  indomethacin (INDOCIN) 50 MG capsule TAKE 1 CAPSULE (50 MG TOTAL) BY MOUTH 3 (THREE) TIMES DAILY WITH MEALS. AS NEEDED FOR GOUT.    Sean Hommel, DO  lisinopril (PRINIVIL,ZESTRIL) 20 MG tablet Take 1 tablet (20 mg total) by mouth daily. 11/16/12   Sean Hommel, DO  predniSONE (STERAPRED UNI-PAK) 10 MG tablet Take as directed for 6 days.--Take 6 on day 1, 5 on day 2, 4 on day 3, then 3 tablets on day 4, then 2 tablets on day 5, then 1 on day 6. 05/26/14   Jacqulyn Cane, MD  probenecid (BENEMID) 500 MG tablet Half a tablet twice a day. 07/23/13   Marcial Pacas, DO  Selenium Sulfide 2.25 % FOAM In shower apply to scalp and back twice a week for two weeks then only once a week as necessary. 09/11/12   Sean Hommel, DO   BP 161/114  Pulse 71  Temp(Src) 98.2 F (36.8 C) (Oral)  Resp 16  Wt 230 lb (104.327 kg)  SpO2 98% Physical Exam  Nursing note and vitals reviewed. Constitutional: He is oriented to person, place, and time. He appears well-developed and well-nourished. No distress.  HENT:  Head: Normocephalic and atraumatic.  Eyes: Conjunctivae and EOM are normal. Pupils are equal, round, and reactive to light. No scleral icterus.  Neck: Normal range of motion.  Cardiovascular: Normal rate.   Pulmonary/Chest: Effort normal.  Abdominal: He exhibits no distension.  Musculoskeletal: Normal range of motion.       Right elbow: He  exhibits swelling. He exhibits no laceration. Tenderness found. Olecranon process tenderness noted. No radial head, no medial epicondyle and no lateral epicondyle tenderness noted.  Swelling, tenderness over fluid-filled right olecranon bursa but no definite bony tenderness. Range of motion intact but flexion exacerbates the pain. Skin within normal limits. No redness or heat. Neurovascular distally intact  Neurological: He is alert and oriented to person, place, and time.  Skin: Skin is warm.  Psychiatric: He has a normal mood and affect.   I offered and advised x-ray right elbow, patient politely refused.--I explained risks of not performing x-ray right elbow.  ED Course  Fine needle aspiration Date/Time: 05/26/2014 8:59 AM Performed by: Burnett Harry, DAVID Authorized by: Burnett Harry, DAVID Consent: Verbal consent obtained. Risks and benefits: risks, benefits and alternatives were discussed Consent given by: patient Patient understanding: patient states understanding of the procedure being performed Patient identity confirmed: verbally with patient Time out: Immediately prior to procedure a "time out" was called to verify the correct patient, procedure, equipment, support staff and site/side marked as required. Preparation: Patient was prepped and draped in the usual sterile fashion (Betadine prep). Local anesthesia used: yes Local anesthetic: Topical pain ease spray. Patient tolerance: Patient tolerated the procedure well with no immediate complications. Comments: Using 22-gauge needle, aspirated 8 cc of slightly bloody, thick clear fluid. Fluid sent for crystal analysis and cell count. Band-Aid and Ace bandage applied. Immediately after aspiration, patient reported significant relief of pain.   Labs Review Labs Reviewed  SYNOVIAL CELL COUNT + DIFF, W/ CRYSTALS    Imaging Review No results found.   MDM   1. Olecranon bursitis, right   2. Acute idiopathic gout of right elbow    Treatment options discussed, as well as risks, benefits, alternatives. Patient voiced understanding and agreement with the following plans:  After fine needle aspiration of right olecranon bursa, 8 cc of fluid aspirated. He immediately felt relief and pain was much improved. See details above.  He declined any blood work, but I advised he follow up with PCP within the next week for uric acid and other blood work. BP rechecked 155/98 .--Continue current BP med (lisinopril) Advised to have PCP recheck BP within the next week  Solu-Medrol 125 mg IM stat.  Prednisone 10 mg-6 day Dosepak. He declined any prescription pain medication but may use Tylenol if needed. Continue the probenecid and colchicine as prescribed by PCP.  An After Visit Summary was printed and given to the patient. Follow-up with your primary care doctor in 5-7 days if not improving, or sooner if symptoms become worse. Precautions discussed. Red flags discussed. Questions invited and answered. Patient voiced understanding and agreement.  Jacqulyn Cane, MD 05/26/14 [phone]

## 2014-05-26 NOTE — Discharge Instructions (Signed)
Today, we withdrew about 8 cc of fluid from right elbow bursa. Likely from gout. We'll send for analysis. Keep Ace bandage right elbow. We gave you a cortisone type shot of Solu-Medrol today. Prescription for prednisone tapering dose pack x6 days  Continue probenecid and colchicine as prescribed by your PCP.   Bursitis Bursitis is when the fluid-filled sac (bursa) that covers and protects a joint gets puffy and irritated. The elbow, shoulder, hip, and knee joints are most often affected. HOME CARE  Put ice on the area.  Put ice in a plastic bag.  Place a towel between your skin and the bag.  Leave the ice on for 15-20 minutes, 03-04 times a day.  Put the joint through a full range of motion 4 times a day. Rest the injured joint at other times. When you have less pain, begin slow movements and usual activities.  Only take medicine as told by your doctor.  Follow up with your doctor. Any delay in care could stop the bursitis from healing. This could cause long-term pain. GET HELP RIGHT AWAY IF:   You have more pain with treatment.  You have a temperature by mouth above 102 F (38.9 C), not controlled by medicine.  You have heat and irritation over the fluid-filled sac. MAKE SURE YOU:   Followup with your primary care physician or rheumatologist within one week.--You might need to go back on preventative gout medication. If any severe worsening symptoms, go to emergency room if needed.

## 2014-05-26 NOTE — ED Notes (Addendum)
Bradley Harper c/o right elbow and arm pain x yesterday. Hx of gout and/or bursitis. Seen rheumatologist and new PCP in past. Also c/o HA

## 2014-05-27 ENCOUNTER — Telehealth: Payer: Self-pay | Admitting: Emergency Medicine

## 2014-05-27 LAB — SYNOVIAL CELL COUNT + DIFF, W/ CRYSTALS
Crystals, Fluid: NONE SEEN
Eosinophils-Synovial: 4 % — ABNORMAL HIGH (ref 0–1)
Lymphocytes-Synovial Fld: 38 % — ABNORMAL HIGH (ref 0–20)
Monocyte/Macrophage: 7 % — ABNORMAL LOW (ref 50–90)
Neutrophil, Synovial: 51 % — ABNORMAL HIGH (ref 0–25)
WBC, Synovial: 720 cu mm — ABNORMAL HIGH (ref 0–200)

## 2014-05-27 NOTE — ED Notes (Addendum)
See above note

## 2014-09-20 ENCOUNTER — Encounter: Payer: Self-pay | Admitting: *Deleted

## 2014-09-20 ENCOUNTER — Emergency Department
Admission: EM | Admit: 2014-09-20 | Discharge: 2014-09-20 | Disposition: A | Discharge status: Home / Self Care | Payer: Managed Care, Other (non HMO) | Source: Home / Self Care | Attending: Emergency Medicine | Admitting: Emergency Medicine

## 2014-09-20 DIAGNOSIS — M10041 Idiopathic gout, right hand: Secondary | ICD-10-CM

## 2014-09-20 DIAGNOSIS — M109 Gout, unspecified: Secondary | ICD-10-CM

## 2014-09-20 MED ORDER — COLCHICINE 0.6 MG PO TABS: 0.6000 mg | ORAL_TABLET | Freq: Two times a day (BID) | ORAL | Status: DC

## 2014-09-20 MED ORDER — PREDNISONE 10 MG PO TABS: 20.0000 mg | ORAL_TABLET | Freq: Every day | ORAL | Status: DC

## 2014-09-20 MED ORDER — INDOMETHACIN 25 MG PO CAPS: 25.0000 mg | ORAL_CAPSULE | Freq: Three times a day (TID) | ORAL | Status: DC | PRN

## 2014-09-20 MED ORDER — METHYLPREDNISOLONE SODIUM SUCC 40 MG IJ SOLR
125.0000 mg | Freq: Once | INTRAMUSCULAR | Status: AC
  Administered 2014-09-20: 125 mg via INTRAMUSCULAR

## 2014-09-20 MED ORDER — OXYCODONE-ACETAMINOPHEN 5-325 MG PO TABS: 2.0000 | ORAL_TABLET | ORAL | Status: DC | PRN

## 2014-09-20 NOTE — ED Provider Notes (Signed)
CSN: [id number]     Arrival date & time 09/20/14  7001 History   First MD Initiated Contact with Patient 09/20/14 [phone]     Chief Complaint  Patient presents with  . Gout  . Headache  . Back Pain   (Consider location/radiation/quality/duration/timing/severity/associated sxs/prior Treatment) Patient is a 59 y.o. male presenting with back pain and hand pain. The history is provided by the patient. No language interpreter was used.  Back Pain Associated symptoms: headaches   Hand Pain This is a new problem. The current episode started 12 to 24 hours ago. The problem occurs constantly. The problem has been gradually worsening. Associated symptoms include headaches. Nothing aggravates the symptoms. Nothing relieves the symptoms. He has tried nothing for the symptoms. The treatment provided no relief.  Pt complains of gout in his right hand.  Pt reports back pain also today.  Pt is on prednisone and colchicine.    Past Medical History  Diagnosis Date  . Gout   . Depression   . Asthma     as child only  . Heartburn   . Prostate cancer 09/28/13    Gleason 4+3=7, volume 18.8 gm  . Hypertension    Past Surgical History  Procedure Laterality Date  . Knee surgery    . Colonoscopy  11-10-2002  . Prostate biopsy  09/28/13    Gleason 7, volume 18.8 gm   Family History  Problem Relation Age of Onset  . Diabetes Mother   . Hypertension Mother   . Gout Mother   . Heart disease Mother   . Kidney disease Mother   . Colon cancer Neg Hx   . Diabetes Father   . Hypertension Father    History  Substance Use Topics  . Smoking status: Former Smoker -- 0.50 packs/day for 5 years    Quit date: 09/16/2006  . Smokeless tobacco: Never Used  . Alcohol Use: Yes     Comment: occ. glass wine    Review of Systems  Musculoskeletal: Positive for back pain and joint swelling.  Neurological: Positive for headaches.  All other systems reviewed and are negative.   Allergies  Statins and  Codeine  Home Medications   Prior to Admission medications   Medication Sig Start Date End Date Taking? Authorizing Provider  citalopram (CELEXA) 20 MG tablet Take 1 tablet (20 mg total) by mouth daily. 09/01/12 09/01/13  Marcial Pacas, DO  colchicine 0.6 MG tablet Take 1 tablet (0.6 mg total) by mouth 2 (two) times daily. 09/20/14   Fransico Meadow, PA-C  indomethacin (INDOCIN) 25 MG capsule Take 1 capsule (25 mg total) by mouth 3 (three) times daily as needed. 09/20/14   Fransico Meadow, PA-C  lisinopril (PRINIVIL,ZESTRIL) 20 MG tablet Take 1 tablet (20 mg total) by mouth daily. 11/16/12   Marcial Pacas, DO  oxyCODONE-acetaminophen (PERCOCET/ROXICET) 5-325 MG per tablet Take 2 tablets by mouth every 4 (four) hours as needed for moderate pain or severe pain. 09/20/14   Fransico Meadow, PA-C  predniSONE (DELTASONE) 10 MG tablet Take 2 tablets (20 mg total) by mouth daily. 09/20/14   Fransico Meadow, PA-C  probenecid (BENEMID) 500 MG tablet Half a tablet twice a day. 07/23/13   Marcial Pacas, DO  Selenium Sulfide 2.25 % FOAM In shower apply to scalp and back twice a week for two weeks then only once a week as necessary. 09/11/12   Sean Hommel, DO   BP 153/101 mmHg  Pulse 95  Temp(Src) 98.4 F (  36.9 C) (Oral)  Resp 18  Ht 6' (1.829 m)  Wt 239 lb (108.41 kg)  BMI 32.41 kg/m2  SpO2 97% Physical Exam  Constitutional: He is oriented to person, place, and time. He appears well-developed and well-nourished.  HENT:  Head: Normocephalic.  Eyes: EOM are normal.  Neck: Normal range of motion.  Cardiovascular: Normal rate.   Pulmonary/Chest: Effort normal and breath sounds normal.  Abdominal: He exhibits no distension.  Musculoskeletal: He exhibits tenderness.  Swollen right hand,  Tender to tocuh,  nv and ns intact  Neurological: He is alert and oriented to person, place, and time.  Skin: Skin is warm.  Psychiatric: He has a normal mood and affect.  Nursing note and vitals reviewed.   ED Course  Procedures  (including critical care time) Labs Review Labs Reviewed - No data to display  Imaging Review No results found.   MDM   1. Gout of right hand, unspecified cause, unspecified chronicity    Solumedrol 125 IM Percocet Indocin Prednisone Schedule to see Dr. Ileene Rubens for evaluation  AVS   Fransico Meadow, PA-C 09/20/14 [phone]

## 2014-09-20 NOTE — Discharge Instructions (Signed)

## 2014-09-20 NOTE — ED Notes (Signed)
Pt c/o RT hand and elbow pain x last night. He reports a history of Gout. He also c/o HA and LBP x last night. Denies injury.

## 2014-09-22 ENCOUNTER — Encounter: Payer: Self-pay | Admitting: Family Medicine

## 2014-09-22 ENCOUNTER — Ambulatory Visit (INDEPENDENT_AMBULATORY_CARE_PROVIDER_SITE_OTHER): Payer: Managed Care, Other (non HMO) | Admitting: Family Medicine

## 2014-09-22 VITALS — BP 122/84 | HR 83 | Ht 72.5 in | Wt 242.0 lb

## 2014-09-22 DIAGNOSIS — Z0000 Encounter for general adult medical examination without abnormal findings: Secondary | ICD-10-CM

## 2014-09-22 DIAGNOSIS — I10 Essential (primary) hypertension: Secondary | ICD-10-CM

## 2014-09-22 DIAGNOSIS — L309 Dermatitis, unspecified: Secondary | ICD-10-CM

## 2014-09-22 DIAGNOSIS — M1A9XX1 Chronic gout, unspecified, with tophus (tophi): Secondary | ICD-10-CM

## 2014-09-22 DIAGNOSIS — C61 Malignant neoplasm of prostate: Secondary | ICD-10-CM | POA: Insufficient documentation

## 2014-09-22 LAB — COMPLETE METABOLIC PANEL WITH GFR
ALT: 19 U/L (ref 0–53)
AST: 16 U/L (ref 0–37)
Albumin: 4.1 g/dL (ref 3.5–5.2)
Alkaline Phosphatase: 72 U/L (ref 39–117)
BILIRUBIN TOTAL: 0.3 mg/dL (ref 0.2–1.2)
BUN: 25 mg/dL — ABNORMAL HIGH (ref 6–23)
CHLORIDE: 105 meq/L (ref 96–112)
CO2: 27 meq/L (ref 19–32)
CREATININE: 1.28 mg/dL (ref 0.50–1.35)
Calcium: 9.8 mg/dL (ref 8.4–10.5)
GFR, Est African American: 71 mL/min
GFR, Est Non African American: 62 mL/min
Glucose, Bld: 85 mg/dL (ref 70–99)
Potassium: 4.5 mEq/L (ref 3.5–5.3)
Sodium: 142 mEq/L (ref 135–145)
TOTAL PROTEIN: 7.1 g/dL (ref 6.0–8.3)

## 2014-09-22 LAB — CBC
HEMATOCRIT: 41.4 % (ref 39.0–52.0)
Hemoglobin: 13.9 g/dL (ref 13.0–17.0)
MCH: 28.8 pg (ref 26.0–34.0)
MCHC: 33.6 g/dL (ref 30.0–36.0)
MCV: 85.9 fL (ref 78.0–100.0)
MPV: 9.7 fL (ref 8.6–12.4)
PLATELETS: 281 10*3/uL (ref 150–400)
RBC: 4.82 MIL/uL (ref 4.22–5.81)
RDW: 15.5 % (ref 11.5–15.5)
WBC: 6.7 10*3/uL (ref 4.0–10.5)

## 2014-09-22 LAB — LIPID PANEL
CHOL/HDL RATIO: 2.3 ratio
CHOLESTEROL: 248 mg/dL — AB (ref 0–200)
HDL: 107 mg/dL (ref >39)
LDL Cholesterol: 113 mg/dL — ABNORMAL HIGH (ref 0–99)
Triglycerides: 139 mg/dL (ref <150)
VLDL: 28 mg/dL (ref 0–40)

## 2014-09-22 LAB — URIC ACID: URIC ACID, SERUM: 7.5 mg/dL (ref 4.0–7.8)

## 2014-09-22 MED ORDER — PREDNISONE 20 MG PO TABS
ORAL_TABLET | ORAL | Status: AC
Start: 2014-09-22 — End: 2014-10-05

## 2014-09-22 MED ORDER — LISINOPRIL 20 MG PO TABS: 20.0000 mg | ORAL_TABLET | Freq: Every day | ORAL | Status: DC

## 2014-09-22 NOTE — Patient Instructions (Signed)
Dr. Ivone Licht's General Advice Following Your Complete Physical Exam  The Benefits of Regular Exercise: Unless you suffer from an uncontrolled cardiovascular condition, studies strongly suggest that regular exercise and physical activity will add to both the quality and length of your life.  The World Health Organization recommends 150 minutes of moderate intensity aerobic activity every week.  This is best split over 3-4 days a week, and can be as simple as a brisk walk for just over 35 minutes "most days of the week".  This type of exercise has been shown to lower LDL-Cholesterol, lower average blood sugars, lower blood pressure, lower cardiovascular disease risk, improve memory, and increase one's overall sense of wellbeing.  The addition of anaerobic (or "strength training") exercises offers additional benefits including but not limited to increased metabolism, prevention of osteoporosis, and improved overall cholesterol levels.  How Can I Strive For A Low-Fat Diet?: Current guidelines recommend that 25-35 percent of your daily energy (food) intake should come from fats.  One might ask how can this be achieved without having to dissect each meal on a daily basis?  Switch to skim or 1% milk instead of whole milk.  Focus on lean meats such as ground turkey, fresh fish, baked chicken, and lean cuts of beef as your source of dietary protein.  Limit saturated fat consumption to less than 10% of your daily caloric intake.  Limit trans fatty acid consumption primarily by limiting synthetic trans fats such as partially hydrogenated oils (Ex: fried fast foods).  Substitute olive or vegetable oil for solid fats where possible.  Moderation of Salt Intake: Provided you don't carry a diagnosis of congestive heart failure nor renal failure, I recommend a daily allowance of no more than 2300 mg of salt (sodium).  Keeping under this daily goal is associated with a decreased risk of cardiovascular events, creeping  above it can lead to elevated blood pressures and increases your risk of cardiovascular events.  Milligrams (mg) of salt is listed on all nutrition labels, and your daily intake can add up faster than you think.  Most canned and frozen dinners can pack in over half your daily salt allowance in one meal.    Lifestyle Health Risks: Certain lifestyle choices carry specific health risks.  As you may already know, tobacco use has been associated with increasing one's risk of cardiovascular disease, pulmonary disease, numerous cancers, among many other issues.  What you may not know is that there are medications and nicotine replacement strategies that can more than double your chances of successfully quitting.  I would be thrilled to help manage your quitting strategy if you currently use tobacco products.  When it comes to alcohol use, I've yet to find an "ideal" daily allowance.  Provided an individual does not have a medical condition that is exacerbated by alcohol consumption, general guidelines determine "safe drinking" as no more than two standard drinks for a man or no more than one standard drink for a male per day.  However, much debate still exists on whether any amount of alcohol consumption is technically "safe".  My general advice, keep alcohol consumption to a minimum for general health promotion.  If you or others believe that alcohol, tobacco, or recreational drug use is interfering with your life, I would be happy to provide confidential counseling regarding treatment options.  General "Over The Counter" Nutrition Advice: Postmenopausal women should aim for a daily calcium intake of 1200 mg, however a significant portion of this might already be   provided by diets including milk, yogurt, cheese, and other dairy products.  Vitamin D has been shown to help preserve bone density, prevent fatigue, and has even been shown to help reduce falls in the elderly.  Ensuring a daily intake of 800 Units of  Vitamin D is a good place to start to enjoy the above benefits, we can easily check your Vitamin D level to see if you'd potentially benefit from supplementation beyond 800 Units a day.  Folic Acid intake should be of particular concern to women of childbearing age.  Daily consumption of 400-800 mcg of Folic Acid is recommended to minimize the chance of spinal cord defects in a fetus should pregnancy occur.    For many adults, accidents still remain one of the most common culprits when it comes to cause of death.  Some of the simplest but most effective preventitive habits you can adopt include regular seatbelt use, proper helmet use, securing firearms, and regularly testing your smoke and carbon monoxide detectors.  Bradley Boehne B. Nykira Reddix DO [facility] [address], [address] [place], [place] [address] Phone: [phone]  

## 2014-09-22 NOTE — Progress Notes (Signed)
CC: Bradley Harper is a 59 y.o. male is here for Annual Exam   Subjective: HPI:  Colonoscopy: Normal 11/2012 with repeat in 2024 Prostate: Prostate Cancer being followed through [facility] urology and rad-onc  Influenza Vaccine: will receive today Pneumovax: no current indication Td/Tdap: Td UTD 2011 Zoster: (Start 58 yo)   No alcohol, tobacco or recreational drug use.  Since I saw him last he has begun androgen deprivation therapy and radiotherapy for prostate cancer. Also one of his physicians has placed him on daily prednisone for gout. Despite this he developed a gout flare earlier this week and had slight benefit from a Solu-Medrol shot however it's worsening and returning in the right wrist.  He reports that he's been experiencing hot flashes and enlarging breast since taking androgen deprivation therapy.  Review of Systems - General ROS: negative for - chills, fever,  weight gain or weight loss Ophthalmic ROS: negative for - decreased vision Psychological ROS: negative for - anxiety or depression ENT ROS: negative for - hearing change, nasal congestion, tinnitus or allergies Hematological and Lymphatic ROS: negative for - bleeding problems, bruising or swollen lymph nodes Breast ROS: negative Respiratory ROS: no cough, shortness of breath, or wheezing Cardiovascular ROS: no chest pain or dyspnea on exertion Gastrointestinal ROS: no abdominal pain, change in bowel habits, or black or bloody stools Genito-Urinary ROS: negative for - genital discharge, genital ulcers, incontinence or abnormal bleeding from genitals Musculoskeletal ROS: negative for - joint pain or muscle pain other than that described above Neurological ROS: negative for - headaches or memory loss Dermatological ROS: negative for lumps, mole changes, rash and skin lesion changes other than some dryness on the left elbow  Past Medical History  Diagnosis Date  . Gout   . Depression   . Asthma     as child only   . Heartburn   . Prostate cancer 09/28/13    Gleason 4+3=7, volume 18.8 gm  . Hypertension     Past Surgical History  Procedure Laterality Date  . Knee surgery    . Colonoscopy  11-10-2002  . Prostate biopsy  09/28/13    Gleason 7, volume 18.8 gm   Family History  Problem Relation Age of Onset  . Diabetes Mother   . Hypertension Mother   . Gout Mother   . Heart disease Mother   . Kidney disease Mother   . Colon cancer Neg Hx   . Diabetes Father   . Hypertension Father     History   Social History  . Marital Status: Married    Spouse Name: N/A    Number of Children: N/A  . Years of Education: N/A   Occupational History  . Not on file.   Social History Main Topics  . Smoking status: Former Smoker -- 0.50 packs/day for 5 years    Quit date: 09/16/2006  . Smokeless tobacco: Never Used  . Alcohol Use: Yes     Comment: occ. glass wine  . Drug Use: No  . Sexual Activity: Not on file   Other Topics Concern  . Not on file   Social History Narrative     Objective: BP 122/84 mmHg  Pulse 83  Ht 6' 0.5" (1.842 m)  Wt 242 lb (109.77 kg)  BMI 32.35 kg/m2  General: No Acute Distress HEENT: Atraumatic, normocephalic, conjunctivae normal without scleral icterus.  No nasal discharge, hearing grossly intact, TMs with good landmarks bilaterally with no middle ear abnormalities, posterior pharynx clear without oral lesions. Neck:  Supple, trachea midline, no cervical nor supraclavicular adenopathy. Buffalo hump mild-to-moderate in severity at the base of the cervical spine. Pulmonary: Clear to auscultation bilaterally without wheezing, rhonchi, nor rales. Cardiac: Regular rate and rhythm.  No murmurs, rubs, nor gallops. No peripheral edema.  2+ peripheral pulses bilaterally. Abdomen: Bowel sounds normal.  No masses.  Non-tender without rebound.  Negative Murphy's sign. GU: Bilateral descended testes MSK: Grossly intact, no signs of weakness.  Full strength throughout upper and  lower extremities.  Full ROM in upper and lower extremities.  No midline spinal tenderness. Neuro: Gait unremarkable, CN II-XII grossly intact.  C5-C6 Reflex 2/4 Bilaterally, L4 Reflex 2/4 Bilaterally.  Cerebellar function intact. Skin: Mild eczematous changes on the left elbow Psych: Alert and oriented to person/place/time.  Thought process normal. No anxiety/depression. Assessment & Plan: Iban was seen today for annual exam.  Diagnoses and associated orders for this visit:  Prostate cancer  Gout with tophus, unspecified cause, unspecified chronicity, unspecified site - Uric acid - predniSONE (DELTASONE) 20 MG tablet; Three tabs daily days 1-3, two tabs daily days 4-6, one tab daily days 7-9, half tab daily days 10-13.  Annual physical exam - Lipid panel - Uric acid - CBC - COMPLETE METABOLIC PANEL WITH GFR  Essential hypertension, benign - lisinopril (PRINIVIL,ZESTRIL) 20 MG tablet; Take 1 tablet (20 mg total) by mouth daily.  Eczema    Eczema: Provided with Cetaphil samples to see if this helps with dryness of his skin. Gout: Acute flare is still present therefore begin prednisone taper. Checking uric acid. Continue probenecid and colchicine. I've advised him to stop taking daily prednisone after this taper due to immunosuppression in the setting of radiotherapy and because he is developing a buffalo hump and I'm worried his blood sugar might come back quite elevated today due to chronic prednisone use. Healthy lifestyle interventions including but not limited to regular exercise, a healthy low fat diet, moderation of salt intake, the dangers of tobacco/alcohol/recreational drug use, nutrition supplementation, and accident avoidance were discussed with the patient and a handout was provided for future reference.   Return in about 4 weeks (around 10/20/2014) for Gout Follow Up.

## 2014-09-23 ENCOUNTER — Telehealth: Payer: Self-pay | Admitting: Family Medicine

## 2014-09-23 DIAGNOSIS — E785 Hyperlipidemia, unspecified: Secondary | ICD-10-CM

## 2014-09-23 DIAGNOSIS — M1A9XX1 Chronic gout, unspecified, with tophus (tophi): Secondary | ICD-10-CM

## 2014-09-23 MED ORDER — PROBENECID 500 MG PO TABS: 500.0000 mg | ORAL_TABLET | Freq: Two times a day (BID) | ORAL | Status: DC

## 2014-09-23 NOTE — Telephone Encounter (Signed)
Pt.notified

## 2014-09-23 NOTE — Telephone Encounter (Signed)
Bradley Harper, Will you please let patient know that his blood cell counts, kidney function, liver function, and blood sugar were all normal.  His LDL cholesterol is mildly elevated however not to a degree that warrants cholesterol lowering medication at this time.  His uric acid level was 7.5 with a goal of less than 6 to minimize risk of gout attacks therefore I'd recommend he begin taking probenecid at a dose of 500mg  twice a day, [facility].  F/U in one month.

## 2014-10-05 ENCOUNTER — Telehealth: Payer: Self-pay | Admitting: *Deleted

## 2014-10-05 NOTE — Telephone Encounter (Addendum)
Pt has gout pain in his wrists,elbow and hand he has been taking all of his gout medication as directed. He states the pain in almost unbearable today. Also pt was supposed to have a procedure today and in which he had to drink a half of gallon of water and his bladder was not full enough to do the procedure. He said the radiologist said it may be because of the gout medication he may be on. He wanted this to mentioned to Dr. Ileene Rubens

## 2014-10-06 MED ORDER — FEBUXOSTAT 40 MG PO TABS: 40.0000 mg | ORAL_TABLET | Freq: Every day | ORAL | Status: DC

## 2014-10-06 NOTE — Telephone Encounter (Signed)
His gout sounds like it's still not under control therefore I"d recommend starting uloric.  He's been on this in the past however I want to maximize his gout medication regimen since he's still experiencing flares. [facility]

## 2014-10-06 NOTE — Telephone Encounter (Signed)
Left message on vm

## 2014-10-10 ENCOUNTER — Encounter: Payer: Self-pay | Admitting: Family Medicine

## 2014-10-10 ENCOUNTER — Ambulatory Visit (INDEPENDENT_AMBULATORY_CARE_PROVIDER_SITE_OTHER): Payer: Managed Care, Other (non HMO) | Admitting: Family Medicine

## 2014-10-10 VITALS — BP 150/101 | HR 104 | Wt 244.0 lb

## 2014-10-10 DIAGNOSIS — C61 Malignant neoplasm of prostate: Secondary | ICD-10-CM

## 2014-10-10 DIAGNOSIS — M1000 Idiopathic gout, unspecified site: Secondary | ICD-10-CM

## 2014-10-10 DIAGNOSIS — K5909 Other constipation: Secondary | ICD-10-CM

## 2014-10-10 MED ORDER — DOCUSATE SODIUM 100 MG PO TABS: 100.0000 mg | ORAL_TABLET | Freq: Two times a day (BID) | ORAL | Status: DC | PRN

## 2014-10-10 MED ORDER — METHYLPREDNISOLONE ACETATE 80 MG/ML IJ SUSP
80.0000 mg | Freq: Once | INTRAMUSCULAR | Status: AC
  Administered 2014-10-10: 80 mg via INTRAMUSCULAR

## 2014-10-10 MED ORDER — OXYCODONE-ACETAMINOPHEN 5-325 MG PO TABS: 2.0000 | ORAL_TABLET | ORAL | Status: DC | PRN

## 2014-10-10 MED ORDER — INDOMETHACIN 25 MG PO CAPS: 25.0000 mg | ORAL_CAPSULE | Freq: Three times a day (TID) | ORAL | Status: AC | PRN

## 2014-10-10 MED ORDER — TRIAMCINOLONE ACETONIDE 0.1 % EX CREA: TOPICAL_CREAM | CUTANEOUS | Status: DC

## 2014-10-10 NOTE — Progress Notes (Signed)
CC: Bradley Harper is a 59 y.o. male is here for gout flare   Subjective: HPI:  Follow-up Gout: Since I saw him last he has tapered off of prednisone. After increasing probenecid he had moderate to severe right wrist pain with swelling and warmth and overlying redness. Symptoms are slightly improved with taking colchicine or oxycodone. He was advised to start on uloric and I was under the impression that he was taking colchicine twice a day on a daily basis when we reconciled his meds last time he was here. He tells me that he takes colchicine only sporadically on an as needed basis for gout flares and not daily. He began to get a headache after increasing the probenecid and due to fears that it was because of the probenecid he stopped it immediately. The headache has improved.  He complains of abdominal discomfort described as generalized and a bloated sensation. Symptoms been present for the last 3-4 days on a daily basis. Nothing particularly makes it better or worse. He tells me that his bowel movements are becoming less frequent since this pain occurred. There is been no nausea nor radiation of his pain. No lack of appetite.  He has a history of prostate cancer currently getting radiation therapy. He wants to know what his PSA was from his physical earlier this month, have informed him that I did not check a PSA because he is undergoing active treatment for his prostate cancer  and I only use this marker is a screening tool for prostate cancer.  Review Of Systems Outlined In HPI  Past Medical History  Diagnosis Date  . Gout   . Depression   . Asthma     as child only  . Heartburn   . Prostate cancer 09/28/13    Gleason 4+3=7, volume 18.8 gm  . Hypertension     Past Surgical History  Procedure Laterality Date  . Knee surgery    . Colonoscopy  11-10-2002  . Prostate biopsy  09/28/13    Gleason 7, volume 18.8 gm   Family History  Problem Relation Age of Onset  . Diabetes Mother    . Hypertension Mother   . Gout Mother   . Heart disease Mother   . Kidney disease Mother   . Colon cancer Neg Hx   . Diabetes Father   . Hypertension Father     History   Social History  . Marital Status: Married    Spouse Name: N/A    Number of Children: N/A  . Years of Education: N/A   Occupational History  . Not on file.   Social History Main Topics  . Smoking status: Former Smoker -- 0.50 packs/day for 5 years    Quit date: 09/16/2006  . Smokeless tobacco: Never Used  . Alcohol Use: Yes     Comment: occ. glass wine  . Drug Use: No  . Sexual Activity: Not on file   Other Topics Concern  . Not on file   Social History Narrative     Objective: BP 150/101 mmHg  Pulse 104  Wt 244 lb (110.678 kg)  General: Alert and Oriented, No Acute Distress HEENT: Pupils equal, round, reactive to light. Conjunctivae clear.  Moist mucous membranes pharynx unremarkable Lungs: Clear comfortable work of breathing Cardiac: Regular rate and rhythm.  Abdomen: Normal bowel sounds, soft and non tender without palpable masses. Extremities: Mild swelling redness and warmth of the right distal radius.  Strong peripheral pulses.  Mental Status: No depression,  anxiety, nor agitation. Skin: Warm and dry. Eczematous changes on the left elbow  Assessment & Plan: Bradley Harper was seen today for gout flare.  Diagnoses and associated orders for this visit:  Prostate cancer - PSA  Idiopathic gout, unspecified chronicity, unspecified site - methylPREDNISolone acetate (DEPO-MEDROL) injection 80 mg; Inject 1 mL (80 mg total) into the muscle once.  Other constipation  Other Orders - triamcinolone cream (KENALOG) 0.1 %; Apply to affected areas twice a day for up to two weeks, avoid face. - Docusate Sodium 100 MG capsule; Take 1 tablet (100 mg total) by mouth 2 (two) times daily as needed for constipation. - indomethacin (INDOCIN) 25 MG capsule; Take 1 capsule (25 mg total) by mouth 3 (three)  times daily as needed. - oxyCODONE-acetaminophen (PERCOCET/ROXICET) 5-325 MG per tablet; Take 2 tablets by mouth every 4 (four) hours as needed for moderate pain or severe pain.    Prostate cancer: He is persistent about wanting to know his PSA therefore an order has been placed. Gout: Uncontrolled, I've recommended that he begin taking colchicine on a daily basis and once this flare resides he should start on Uloric, he is adamant that Uloric precipitates gout flares and I tried my best to describe to him that this could happen however it sounds like he was never on colchicine to prevent any these flares for the first few months while starting Uloric.  He politely states that he is not going to ever take Uloric.  Depo-Medrol was provided today for his flare in the right wrist. He only wants to take an approach of treating flares and not prolactin therapy. Constipation: Start docusate, provided with warnings that oxycodone could worsen constipation. Triamcinolone provided for eczematous changes on the left elbow   Return in about 3 months (around 01/09/2015) for Gout.

## 2014-10-11 LAB — PSA: PSA: 0.18 ng/mL (ref <=4.00)

## 2014-10-12 ENCOUNTER — Telehealth: Payer: Self-pay

## 2014-10-12 ENCOUNTER — Telehealth: Payer: Self-pay | Admitting: *Deleted

## 2014-10-12 MED ORDER — METHYLPREDNISOLONE (PAK) 4 MG PO TABS: ORAL_TABLET | ORAL | Status: DC

## 2014-10-12 NOTE — Telephone Encounter (Signed)
Spoke with pt and he states he gout flare up was not affected by the depo medrol that was administered on Monday. He states usually when he receives that medication he feels a bee sting and he then he waits for a few min in the room to make sure that he doesn't have a reaction. He was concerned that medication may have been"bad" because his arm was still swollen and he was still swollen until he took 4 prednisone that he had left and 2 colchicine. He wonders why this injection didn't work this time. Pt feels frustrated because he cant seem to be completley resolved of pain from this recent occurrence. Please advise

## 2014-10-12 NOTE — Telephone Encounter (Signed)
[facility] for this persistent flare. This is a steroid like prednisone so no need to take prednisone while taking this medication.

## 2014-10-12 NOTE — Telephone Encounter (Signed)
Patient called and left a message asking for a return call. Gout.

## 2014-10-13 NOTE — Telephone Encounter (Signed)
Left message on vm

## 2014-10-18 ENCOUNTER — Encounter: Payer: Self-pay | Admitting: Family Medicine

## 2014-10-18 ENCOUNTER — Ambulatory Visit (INDEPENDENT_AMBULATORY_CARE_PROVIDER_SITE_OTHER): Payer: Managed Care, Other (non HMO) | Admitting: Family Medicine

## 2014-10-18 VITALS — BP 127/75 | HR 96 | Wt 243.0 lb

## 2014-10-18 DIAGNOSIS — R51 Headache: Secondary | ICD-10-CM

## 2014-10-18 DIAGNOSIS — R519 Headache, unspecified: Secondary | ICD-10-CM

## 2014-10-18 DIAGNOSIS — T889XXA Complication of surgical and medical care, unspecified, initial encounter: Secondary | ICD-10-CM

## 2014-10-18 NOTE — Progress Notes (Signed)
CC: Bradley Harper is a 59 y.o. male is here for discuss Ha's   Subjective: HPI:  Complains of a headache that occurs every day he has radiation therapy. It occurs within minutes after therapy has started. He is worried that the radiation technician is directing radiation and sprain instead of his prostate. It starts seconds after he's lying down when a light from the machine used for his radiation therapy shines a light in his left eye. It starts with left eye pain and then manifests as a pressure on the crown of his head. The only time it ever happens is when light is being shined in his eye. It goes away after24 hours after radiation therapy. He has not experienced it this week because he elected to skip a treatment. He denies any other motor or sensory disturbances. Denies fevers, chills nor memory loss.  Complains of a knot on the left and right shoulder. It's only been present ever since he was getting radiation therapy. It is painless. It has not been getting better or worse. No interventions as of yet. He is addressed this with his radiation team however he feels that they have brushed it off.   Review Of Systems Outlined In HPI  Past Medical History  Diagnosis Date  . Gout   . Depression   . Asthma     as child only  . Heartburn   . Prostate cancer 09/28/13    Gleason 4+3=7, volume 18.8 gm  . Hypertension     Past Surgical History  Procedure Laterality Date  . Knee surgery    . Colonoscopy  11-10-2002  . Prostate biopsy  09/28/13    Gleason 7, volume 18.8 gm   Family History  Problem Relation Age of Onset  . Diabetes Mother   . Hypertension Mother   . Gout Mother   . Heart disease Mother   . Kidney disease Mother   . Colon cancer Neg Hx   . Diabetes Father   . Hypertension Father     History   Social History  . Marital Status: Married    Spouse Name: N/A    Number of Children: N/A  . Years of Education: N/A   Occupational History  . Not on file.   Social  History Main Topics  . Smoking status: Former Smoker -- 0.50 packs/day for 5 years    Quit date: 09/16/2006  . Smokeless tobacco: Never Used  . Alcohol Use: Yes     Comment: occ. glass wine  . Drug Use: No  . Sexual Activity: Not on file   Other Topics Concern  . Not on file   Social History Narrative     Objective: BP 127/75 mmHg  Pulse 96  Wt 243 lb (110.224 kg)  General: Alert and Oriented, No Acute Distress HEENT: Pupils equal, round, reactive to light. Conjunctivae clear.  Moist mucous membranes pharynx unremarkable. Lungs: clearing comfortable work of breathing Cardiac: Regular rate and rhythm.  Extremities: No peripheral edema.  Strong peripheral pulses. Palpable and painless insertion of the deltoid muscle to the lateral proximal more humerus. Neuro: Cranial nerves II-12 grossly intact Mental Status: No depression, anxiety, nor agitation. Skin: Warm and dry.  Assessment & Plan: Bradley Harper was seen today for discuss ha's.  Diagnoses and associated orders for this visit:  Nonintractable headache, unspecified chronicity pattern, unspecified headache type  Side effects of treatment, initial encounter    Headache: It sounds like this headache is simply just due to a bright  light being shown in his left eye during his radiation therapy. Reassured him that it is extremely unlikely that they are applying radiation to his brain based on my review of their office notes. I provided him with a letter that he can provide to his radiation team requesting that his face be shielded from any lights being shown directly in his eyes. The firmness that he is feeling on his shoulders is almost certainly the insertion of the deltoid muscle on the humerus. I discussed that it's most likely more noticeable to him now since he's been on prednisone for his gout for so long and appears been redistribution of his fat from the peripherally to proximally. Discussed benign nature of what he is  feeling. Reassurance provided  25 minutes spent face-to-face during visit today of which at least 50% was counseling or coordinating care regarding: 1. Nonintractable headache, unspecified chronicity pattern, unspecified headache type   2. Side effects of treatment, initial encounter       Return if symptoms worsen or fail to improve.

## 2014-10-20 ENCOUNTER — Ambulatory Visit: Payer: Managed Care, Other (non HMO) | Admitting: Family Medicine

## 2014-11-07 ENCOUNTER — Encounter: Payer: Self-pay | Admitting: Family Medicine

## 2014-11-07 ENCOUNTER — Ambulatory Visit (INDEPENDENT_AMBULATORY_CARE_PROVIDER_SITE_OTHER): Payer: Managed Care, Other (non HMO) | Admitting: Family Medicine

## 2014-11-07 VITALS — BP 129/82 | HR 100 | Wt 252.0 lb

## 2014-11-07 DIAGNOSIS — B379 Candidiasis, unspecified: Secondary | ICD-10-CM

## 2014-11-07 DIAGNOSIS — M1A9XX1 Chronic gout, unspecified, with tophus (tophi): Secondary | ICD-10-CM

## 2014-11-07 DIAGNOSIS — C61 Malignant neoplasm of prostate: Secondary | ICD-10-CM

## 2014-11-07 MED ORDER — TRIAMCINOLONE ACETONIDE 0.1 % EX CREA: TOPICAL_CREAM | CUTANEOUS | Status: DC

## 2014-11-07 MED ORDER — PREDNISONE 20 MG PO TABS: ORAL_TABLET | ORAL | Status: DC

## 2014-11-07 MED ORDER — COLCHICINE 0.6 MG PO TABS: 0.6000 mg | ORAL_TABLET | Freq: Two times a day (BID) | ORAL | Status: AC

## 2014-11-07 MED ORDER — CLOTRIMAZOLE 1 % EX CREA: TOPICAL_CREAM | CUTANEOUS | Status: AC

## 2014-11-07 NOTE — Progress Notes (Signed)
CC: Bradley Harper is a 59 y.o. male is here for gout in right hand   Subjective: HPI:  Complains of bilateral shoulder swelling and proximal arm swelling that's been present for matter of a month now. There was a mass that he was identifying in his right arm that he pointed out to me last time he was here a few weeks ago and I advised him that I felt that it was his deltoid muscle. He shows me today that the mass is not as apparent and that the muscles on the lateral superior aspect of his right shoulder seems less firm than that on the left. He repeatedly states "something is wrong" with the above findings but he denies any pain. He denies any decreased strength and just prior to visiting me today change the tires on his wife car by himself.  He denies any decreased range of motion.  Complains of a rash in both underarms that is red and itchy. Occasionally it burns. It's mild to moderate in severity and also localized to the groin. He's been told that he can only use cornstarch to treat this rash by his radiologist and oncologist. Denies skin changes elsewhere  He believes that the dryness on his left elbow is worsening. He has run out of triamcinolone cream  Complains of right hand pain localized in the wrist and in the MCPs that has been present for matter of months that he has attributed to gout. It is warm, painful and hurts to grip. There is some overlying redness. It improved with Medrol a few weeks ago only to slowly worsen. It's improved if he keeps his hand in a motionless state. It is worse with any motion of the hand. There's been no fevers, chills, nor joint pain exacerbation elsewhere.  He still having difficulty with [facility] accommodating his request of keeping a laser out of his eye during his radiation treatment. He gets moderate to severe headaches the second that this laser is accident shown into his eye. They have been unable to provide him with any shield to prevent this  from happening. He's brought this to their attention many times and it sounds like the letter that I wrote for him at his last visit did not make any difference in how they are taking care of him.   Review Of Systems Outlined In HPI  Past Medical History  Diagnosis Date  . Gout   . Depression   . Asthma     as child only  . Heartburn   . Prostate cancer 09/28/13    Gleason 4+3=7, volume 18.8 gm  . Hypertension     Past Surgical History  Procedure Laterality Date  . Knee surgery    . Colonoscopy  11-10-2002  . Prostate biopsy  09/28/13    Gleason 7, volume 18.8 gm   Family History  Problem Relation Age of Onset  . Diabetes Mother   . Hypertension Mother   . Gout Mother   . Heart disease Mother   . Kidney disease Mother   . Colon cancer Neg Hx   . Diabetes Father   . Hypertension Father     History   Social History  . Marital Status: Married    Spouse Name: N/A  . Number of Children: N/A  . Years of Education: N/A   Occupational History  . Not on file.   Social History Main Topics  . Smoking status: Former Smoker -- 0.50 packs/day for 5 years  Quit date: 09/16/2006  . Smokeless tobacco: Never Used  . Alcohol Use: Yes     Comment: occ. glass wine  . Drug Use: No  . Sexual Activity: Not on file   Other Topics Concern  . Not on file   Social History Narrative     Objective: BP 129/82 mmHg  Pulse 100  Wt 252 lb (114.306 kg)  General: Alert and Oriented, No Acute Distress HEENT: Pupils equal, round, reactive to light. Conjunctivae clear.  Moist mucous membranes pharynx unremarkable Lungs: Comfortable work of breathing Cardiac: Regular rate and rhythm.  Extremities: No peripheral edema.  Strong peripheral pulses. Full range of motion and strength in both shoulders. No gross abnormality that I can appreciate today. No palpable abnormality in the soft tissues of the bilateral shoulderS or humeral region. The right wrist and right MCPs are mildly erythematous  and mildly warmer compared to the rest of the body. These locations are painful to touch Mental Status: No depression, anxiety, nor agitation. Skin: Warm and dry. Mildly erythematous coalescing macules with slight scaling and satellite lesions in both axillary regions.  Assessment & Plan: Jhair was seen today for gout in right hand.  Diagnoses and all orders for this visit:  Candida infection Orders: -     clotrimazole (LOTRIMIN) 1 % cream; Apply to affected areas twice a day for up to four weeks, applying up to two weeks after resolution of symptoms.  Gout with tophus, unspecified cause, unspecified chronicity, unspecified site Orders: -     colchicine 0.6 MG tablet; Take 1 tablet (0.6 mg total) by mouth 2 (two) times daily. -     predniSONE (DELTASONE) 20 MG tablet; Three tabs daily days 1-3, two tabs daily days 4-6, one tab daily days 7-9, half tab daily days 10-13.  Prostate cancer Orders: -     Ambulatory referral to Radiation Oncology  Other orders -     triamcinolone cream (KENALOG) 0.1 %; Apply to affected areas twice a day for up to two weeks, avoid face.   Skin candidiasis: Start clotrimazole. Suspect that he'll need to use this through his prednisone taper and for maybe even a week or 2 afterwards. Gout: Uncontrolled, he had run out of colchicine this is been refilled today prednisone taper for his flareup in the hand I have referred him back to the [facility] for further management of his prostate cancer, he is understandably upset with the [facility] treatment team and would prefer to have his care transferred elsewhere. He has been advised to continue with [facility] so as to not interrupt any treatment but he states that he so disappointed with them that he just needs to go to a different office. Refilling triamcinolone cream for his left elbow. Unable to examine this today due to running over the alloted time schedule by the time that he had brought this  up to me. I tried to provide reassurance that I did not detect any abnormality in his shoulders but given his concerns I asked him to follow-up with Dr. Darene Lamer in the sports medicine clinic for a second opinion.  Per his request a copy of this note will be mailed to him.  40 minutes spent face-to-face during visit today of which at least 50% was counseling or coordinating care regarding: 1. Candida infection   2. Gout with tophus, unspecified cause, unspecified chronicity, unspecified site   3. Prostate cancer      Return if symptoms worsen or fail to  improve, for Dr T second opinion.

## 2014-11-08 ENCOUNTER — Telehealth: Payer: Self-pay

## 2014-11-08 ENCOUNTER — Telehealth: Payer: Self-pay | Admitting: Family Medicine

## 2014-11-08 MED ORDER — TRIAMCINOLONE ACETONIDE 0.1 % EX CREA: TOPICAL_CREAM | CUTANEOUS | Status: AC

## 2014-11-08 NOTE — Telephone Encounter (Addendum)
Opened in error

## 2014-11-08 NOTE — Telephone Encounter (Signed)
Refill req 

## 2014-11-15 ENCOUNTER — Encounter: Payer: Self-pay | Admitting: Radiation Oncology

## 2014-11-15 ENCOUNTER — Ambulatory Visit
Admission: RE | Admit: 2014-11-15 | Discharge: 2014-11-15 | Disposition: A | Discharge status: Home / Self Care | Payer: Managed Care, Other (non HMO) | Source: Ambulatory Visit | Attending: Radiation Oncology | Admitting: Radiation Oncology

## 2014-11-15 VITALS — BP 137/91 | HR 72 | Temp 98.7°F | Ht 72.5 in | Wt 252.6 lb

## 2014-11-15 DIAGNOSIS — C61 Malignant neoplasm of prostate: Secondary | ICD-10-CM | POA: Diagnosis not present

## 2014-11-15 NOTE — Progress Notes (Signed)
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 male who is seen today through the courtesy of Dr. Ileene Rubens for consideration of completion of external beam radiation therapy in the management of his clinical stageT2a intermediate risk adenocarcinoma prostate.  His history is well documented in notes from [facility].  Briefly, he presented with an elevated PSA of 8.8 was found have Gleason 7 (4+3) adenocarcinoma at the time of prostate biopsies on 09/28/2013.  His staging workup included a MRI scan on 05/13/2014 which showed nodular T2 hypointense disease along the lateral aspect of the midportion of the left peripheral zone suspicious for neoplasm.  There was no evidence to suggest extracapsular extension. He had second opinions with multiple doctors including Dr. Dutch Gray here in  [place] for consideration of surgery and Dr. Mariane Masters at [facility] in [place] for discussion of radiation therapy.  He eventually elected for short term (6 months) androgen deprivation therapy along with external beam/IMRT at [facility].  He started androgen deprivation therapy on 05/27/2014.  He had numerous issues with his treatment which were not clearly related to effects of radiation therapy.  He eventually lost "trust" and wanted to have his treatment transferred to [facility].  He had a good working relationship with Dr. Tharon Aquas, but had difficulty primarily with the staff.  Today he received a cumulative dose of 5040 cGy in 28 sessions of a scheduled 7920 cGy in 44 sessions.  I should mention that the time of his biopsy his gland volume was felt to be approximately 18.8 mL.  With his  androgen deprivation therapy, he, as expected, has had difficulty with his sex drive, fatigue, and hot flashes.  He does admit to urinary frequency, urgency, and rectal discomfort.  PREVIOUS RADIATION THERAPY: As above.   PAST MEDICAL HISTORY:  has a past medical history of Gout; Depression; Asthma; Heartburn; Prostate cancer (09/28/13); and Hypertension.     PAST SURGICAL HISTORY:  Past Surgical History  Procedure Laterality Date  . Knee surgery    . Colonoscopy  11-10-2002  . Prostate biopsy  09/28/13    Gleason 7, volume 18.8 gm     FAMILY HISTORY: family history includes Diabetes in his father and mother; Gout in his mother; Heart disease in his mother; Hypertension in his father and mother; Kidney disease in his mother. There is no history of Colon cancer. His father is 48 and lives independently.  His mother died at age 52 from kidney failure and "medical malpractice".   SOCIAL HISTORY:  reports that he quit smoking about 8 years ago. He has never used smokeless tobacco. He reports that he drinks alcohol. He reports that he does not use illicit drugs. Married, 2 children.  He works in Research scientist (medical).   ALLERGIES: Shellfish allergy and Codeine   MEDICATIONS:  Current Outpatient Prescriptions  Medication Sig Dispense Refill  . clotrimazole (LOTRIMIN) 1 % cream Apply to affected areas twice a day for up to four weeks, applying up to two weeks after resolution of symptoms. 30 g 2  . colchicine 0.6 MG tablet Take 1 tablet (0.6 mg total) by mouth 2 (  two) times daily. 60 tablet 11  . diphenhydrAMINE (SOMINEX) 25 MG tablet Take 25 mg by mouth every 6 (six) hours as needed for sleep.    Mariane Baumgarten Sodium 100 MG capsule Take 1 tablet (100 mg total) by mouth 2 (two) times daily as needed for constipation. 10 tablet 0  . indomethacin (INDOCIN) 25 MG capsule Take 1 capsule (25 mg total) by mouth 3 (three) times daily as needed. 30 capsule 1  . lisinopril  (PRINIVIL,ZESTRIL) 20 MG tablet Take 1 tablet (20 mg total) by mouth daily. 30 tablet 5  . triamcinolone cream (KENALOG) 0.1 % Apply to affected areas twice a day for up to two weeks, avoid face. 80 g 0  . tamsulosin (FLOMAX) 0.4 MG CAPS capsule Take 0.4 mg by mouth.     No current facility-administered medications for this encounter.     REVIEW OF SYSTEMS:  Pertinent items are noted in HPI.    PHYSICAL EXAM:  height is 6' 0.5" (1.842 m) and weight is 252 lb 9.6 oz (114.579 kg). His temperature is 98.7 F (37.1 C). His blood pressure is 137/91 and his pulse is 72.   Alert and oriented.  Rectal examination: His prostate gland is small and clearly less than 15-20 mL.  There is no discrete nodularity, but the gland is slightly indurated throughout.   LABORATORY DATA:  Lab Results  Component Value Date   WBC 6.7 09/22/2014   HGB 13.9 09/22/2014   HCT 41.4 09/22/2014   MCV 85.9 09/22/2014   PLT 281 09/22/2014   Lab Results  Component Value Date   NA 142 09/22/2014   K 4.5 09/22/2014   CL 105 09/22/2014   CO2 27 09/22/2014   Lab Results  Component Value Date   ALT 19 09/22/2014   AST 16 09/22/2014   ALKPHOS 72 09/22/2014   BILITOT 0.3 09/22/2014   PSA prior to antigen deprivation therapy 8.8, PSA in December 2015 was 0.18   IMPRESSION: StageT2a intermediate risk adenocarcinoma prostate.  I told the patient that he really would be best served by continuing with his external beam radiation therapy as prescribed without a significant treatment interruption.  An alternative would be for a brachytherapy boost, perhaps with HDR.  His gland is really too small to consider a seed implant boost with iodine 125.  He had a good working relationship with Dr. Tharon Aquas, and he tells me that he is willing to consider an HDR boost if this is possible.  The patient would like to contact Dr. Tharon Aquas tomorrow and see if this is an option.  If not, we can continue with his external beam/IMRT here in  [place], but this will take at least 1 to 1-1/2 weeks for CT simulation/treatment planning and initiation of treatment.  Ideally, he should not have a treatment rest.  At this point in time the patient does not wish to have my records/consultation sent to  [facility].  PLAN: As discussed above.  The patient will contact Dr. Tharon Aquas to see if he would be a candidate for an HDR boost.  The patient is then to contact me.  I spent 60  minutes face to face with the patient and more than 50% of that time was spent in counseling and/or coordination of care.

## 2014-11-15 NOTE — Progress Notes (Signed)
GU Location of Tumor / Histology: Adenocarcinoma of the Prostate,T2aN0M0        Prostate Cancer, Gleason Score is (4 + 3) and PSA is (8.8)  Bradley Harper presented in May 2015 to Dr. Raynelle Bring with an elevated PSA of 8.8 and normal digital rectal exam prompting a 12 core prostate needle biopsy by Dr. Redmond Pulling on 09/30/13. This demonstrated Gleason 4+3=7 adenocarcinoma of the prostate. He also was noted to have microscopic hematuria in November 2014 and underwent a full evaluation including CT imaging and cystoscopy.  No identifiable cause for his hematuria was found and no lymphadenopathy was noted.   Biopsies of Prostate (if applicable) revealed:    Past/Anticipated interventions by urology, if any: Dr. Raynelle Bring  Past/Anticipated interventions by medical oncology, if any: None  Weight changes, if any: Gained 25+ since Androgen Deprivation  Bowel/Bladder complaints, if any: Complete Emptying, but stop and start pattern, nocturia x 3-4. No dysuria  Nausea/Vomiting, if any: None  Pain issues, if any: No bladder pain. Only right knee pain  SAFETY ISSUES:  Prior radiation? Pelvic Irradiation at [facility] - 28 out of planned 44.  Elected to complete at [facility]  Pacemaker/ICD? No  Possible current pregnancy? No  Is the patient on methotrexate? No  Current Complaints / other details:    Sever and Uncontrolled gout diagnosed 14 years ago with multiple therapies in the past.

## 2014-11-17 ENCOUNTER — Ambulatory Visit: Payer: Managed Care, Other (non HMO)

## 2014-11-17 ENCOUNTER — Ambulatory Visit: Payer: Managed Care, Other (non HMO) | Admitting: Radiation Oncology

## 2014-11-24 ENCOUNTER — Encounter: Payer: Self-pay | Admitting: Radiation Oncology

## 2014-11-24 NOTE — Progress Notes (Signed)
Chart note:   I left a message with Mr. Bradley Harper last night, and he called me back this morning.  He told me that he visited Dr. Truman Hayward at [facility] but Dr. Truman Hayward did not feel comfortable giving him an HDR boost.  Therefore, we will continue with his external beam radiation therapy here in [place].  I will have him visit for CT simulation early next week and resume his radiation therapy the following week.  From a technical standpoint, Dr. Tharon Aquas has thus far delivered 5040 cGy in 28 sessions with a prescribed dose of 7920 cGy in 44 sessions.  So he has a further 2880 cGy to be delivered in 16 sessions.  Of note is that Dr. Tharon Aquas uses a 7 mm PTV expansion superiorly, 5 mm posteriorly, and 6 mm elsewhere.  The patient does not have gold seed markers, and we will use cone beam CT alone for daily image guidance.  Consent will be signed when he visits [place] for CT simulation.

## 2014-11-28 ENCOUNTER — Ambulatory Visit (INDEPENDENT_AMBULATORY_CARE_PROVIDER_SITE_OTHER): Payer: Managed Care, Other (non HMO) | Admitting: Sports Medicine

## 2014-11-28 ENCOUNTER — Encounter: Payer: Self-pay | Admitting: Sports Medicine

## 2014-11-28 ENCOUNTER — Ambulatory Visit (INDEPENDENT_AMBULATORY_CARE_PROVIDER_SITE_OTHER): Payer: Managed Care, Other (non HMO)

## 2014-11-28 VITALS — BP 130/91 | HR 82 | Ht 72.0 in | Wt 248.0 lb

## 2014-11-28 DIAGNOSIS — M1000 Idiopathic gout, unspecified site: Secondary | ICD-10-CM | POA: Diagnosis not present

## 2014-11-28 DIAGNOSIS — M6259 Muscle wasting and atrophy, not elsewhere classified, multiple sites: Secondary | ICD-10-CM

## 2014-11-28 DIAGNOSIS — C61 Malignant neoplasm of prostate: Secondary | ICD-10-CM | POA: Diagnosis not present

## 2014-11-28 DIAGNOSIS — M62511 Muscle wasting and atrophy, not elsewhere classified, right shoulder: Secondary | ICD-10-CM | POA: Diagnosis not present

## 2014-11-28 DIAGNOSIS — Z8546 Personal history of malignant neoplasm of prostate: Secondary | ICD-10-CM

## 2014-11-28 DIAGNOSIS — R531 Weakness: Secondary | ICD-10-CM

## 2014-11-28 NOTE — Assessment & Plan Note (Signed)
On wasting of the shoulder girdle muscles on the right. Really denies any pain or paresthesias. Considering history of prostate cancer we are going to x-ray his neck, shoulder, humerus, and right elbow. I would also like nerve conduction and electromyography. Return in one month.

## 2014-11-28 NOTE — Assessment & Plan Note (Signed)
Per patient request checking immunoglobulins, CBC. I do think his fatigue is likely related to his hormonal therapy Certainly if he desires he could have a stress test/echocardiogram. Will defer to PCP for this.

## 2014-11-28 NOTE — Assessment & Plan Note (Signed)
Refusing Uloric, probenecid, and allopurinol. I do think he is having a mild flare in his elbow, the elbow joint was injected today under ultrasound guidance. Return in one month.

## 2014-11-28 NOTE — Progress Notes (Signed)
Subjective:    I'm seeing this patient as a consultation for:  Dr. Marcial Pacas  CC:  Right arm weakness and muscle wasting  HPI:  Patient presents with complaint of 3 weeks of weakness in his right arm with overhead activity and extention at the elbow. He also complains that his right deltoid and biceps muscles continue to waste away despite his efforts to build muscle by lifting weights. Of note the patient is right handed. He denies any history of trauma, neck pain, numbness or pain radiating down his arm. He is worried about the weakness and believes that it is coming from the radiation therapy he is receiving for his prostate. He denies having any direct radiation to his shoulder as his prostate cancer was diagnosed as stage 2 and he is unaware of any metastatic lesions. He has never had advanced imaging such as PET scan.   Coincidentally, the patient believes he is having a gout flare in his right elbow now. Over the past 30 years he has tried multiple medications to lower his uric acid levels, but all of the medications seemed to make his symptoms worse. He would rather live with the pain of gout than try Uloric again.   Lastly, the patient complains of increasing fatigue. He feels tired after walking across the parking lot and can no longer keep up with his grandson. He worries that this fatigue stems from his "immune system being beaten up by all of the radiation" or from his cardiovascular system. He denies chest pain. He is taking a testosterone blocker and has been suffering form hot flashes. He would like to check his immune system and his cardiovascular health.  Past medical history, Surgical history, Family history not pertinant except as noted below, Social history, Allergies, and medications have been entered into the medical record, reviewed, and no changes needed.   Review of Systems: No headache, visual changes, nausea, vomiting, diarrhea, constipation, dizziness, abdominal  pain, skin rash, fevers, chills, night sweats, weight loss, swollen lymph nodes, body aches, joint swelling, muscle aches, chest pain, shortness of breath, mood changes, visual or auditory hallucinations.   Objective:   General: Well Developed, well nourished, and in no acute distress.  Neuro/Psych: Alert and oriented x3, extra-ocular muscles intact, able to move all 4 extremities, sensation grossly intact. Skin: Warm and dry, no rashes noted.  Respiratory: Not using accessory muscles, speaking in full sentences, trachea midline.  Cardiovascular: Pulses palpable, no extremity edema. Abdomen: Does not appear distended. Psych: Patient with appropriate insight and judgement. Patient appears to have a sad mood and flattened affect. MSK:  No neck tenderness, no paraspinal tenderness.  Right Shoulder:  Inspection reveals visible atrophy of right biceps, triceps, and deltoid muscles with obvious asymmetry. Palpation is normal with no tenderness over deltoid, AC joint or bicipital groove. ROM is full in all planes. Strength in biceps, triceps, deltoid 4/5 and weak compared to contralateral side.  Handgrip strength is reduced compared to contralateral side.  Biceps, triceps, brachioradialis reflexes 1+ and symmetric. Speeds and Yergason's tests normal. Normal scapular function observed. No painful arc and no drop arm sign.  Right Elbow: Visible swelling on inspection, and diffusely tender to palpation. Range of motion full pronation, supination, flexion. Extension is somewhat limited by pain. Strength is 4/5 to all of the above directions.   Procedure: Real-time Ultrasound Guided Injection of right elbow Device: GE Logiq E  Verbal informed consent obtained.  Time-out conducted.  Noted no overlying erythema, induration, or  other signs of local infection.  Skin prepped in a sterile fashion.  Local anesthesia: Topical Ethyl chloride.  With sterile technique and under real time ultrasound  guidance:  From an anterior approach I entered the elbow joint which appeared to have an effusion, 1 mL kenalog 40, 2 mL lidocaine injected easily. Completed without difficulty  Pain immediately resolved suggesting accurate placement of the medication.  Advised to call if fevers/chills, erythema, induration, drainage, or persistent bleeding.  Images permanently stored and available for review in the ultrasound unit.  Impression: Technically successful ultrasound guided injection.  Impression and Recommendations:   This case required medical decision making of moderate complexity.  # Weakness - Patient with visible wasting of multiple right shoulder muscles, likely due to neuropathy or radiculopathy - Will obtain XR Neck, R shoulder, R elbow to r/o bony metastasis or structural lesion - Patient referred for nerve conduction study and electromyography - Will consider MRI neck in the future  # Gout - Patient with active flare in right elbow today - Patient unable to tolerate uloric, probenecid, allopurinol in the past and declines further treatment with such - Ultrasound guided injection of kenalog/lidocaine into right elbow performed today (see procedure note) - Will check uric acid level today  # Fatigue - The patients' fatigue is likely a side effect of his cancer treatment and hormone blockade - Will check CBC and immunoglobulins per patient request - Patient also interested in cardiac workup but will defer to PCP  Follow up in 4 weeks or sooner as needed

## 2014-11-29 ENCOUNTER — Ambulatory Visit
Admission: RE | Admit: 2014-11-29 | Discharge: 2014-11-29 | Disposition: A | Discharge status: Home / Self Care | Payer: Managed Care, Other (non HMO) | Source: Ambulatory Visit | Attending: Radiation Oncology | Admitting: Radiation Oncology

## 2014-11-29 ENCOUNTER — Encounter: Payer: Self-pay | Admitting: Radiation Oncology

## 2014-11-29 DIAGNOSIS — C61 Malignant neoplasm of prostate: Secondary | ICD-10-CM

## 2014-11-29 LAB — CBC WITH DIFFERENTIAL/PLATELET
Basophils Absolute: 0 10*3/uL (ref 0.0–0.1)
Basophils Relative: 1 % (ref 0–1)
Eosinophils Absolute: 0 10*3/uL (ref 0.0–0.7)
Eosinophils Relative: 1 % (ref 0–5)
HCT: 42 % (ref 39.0–52.0)
Hemoglobin: 13.9 g/dL (ref 13.0–17.0)
Lymphocytes Relative: 32 % (ref 12–46)
Lymphs Abs: 1.1 K/uL (ref 0.7–4.0)
MCH: 29 pg (ref 26.0–34.0)
MCHC: 33.1 g/dL (ref 30.0–36.0)
MCV: 87.5 fL (ref 78.0–100.0)
MPV: 10.1 fL (ref 8.6–12.4)
Monocytes Absolute: 0.2 10*3/uL (ref 0.1–1.0)
Monocytes Relative: 5 % (ref 3–12)
Neutro Abs: 2.1 10*3/uL (ref 1.7–7.7)
Neutrophils Relative %: 61 % (ref 43–77)
Platelets: 222 10*3/uL (ref 150–400)
RBC: 4.8 MIL/uL (ref 4.22–5.81)
RDW: 15.4 % (ref 11.5–15.5)
WBC: 3.5 10*3/uL — ABNORMAL LOW (ref 4.0–10.5)

## 2014-11-29 LAB — IGG, IGA, IGM
IgA: 270 mg/dL (ref 68–379)
IgG (Immunoglobin G), Serum: 964 mg/dL (ref 650–1600)
IgM, Serum: 103 mg/dL (ref 41–251)

## 2014-11-29 LAB — URIC ACID: Uric Acid, Serum: 7.7 mg/dL (ref 4.0–7.8)

## 2014-11-29 NOTE — Progress Notes (Signed)
Complex in relation/treatment planning note: The patient was taken to the CT simulator.  A Vac lock immobilization device was constructed.  He was then catheterized and contrast instilled into the bladder/urethra.  The CT data set was sent to the  MIM planning system where contoured his prostate, bladder, and rectum.  I am prescribing 2880 cGy in 16 sessions utilizing VMAT IMRT.  The prostate will be expanded by 0.8 cm except for 0.5 cm along the rectum for his PTV.  We will perform a decay calculation because of his 3 week rest to determine if he needs a additional treatment than what was initially prescribed at [facility].

## 2014-11-29 NOTE — Progress Notes (Signed)
Chart note: Because of the patient's break after his first 5040 cGy in 28 sessions,  I had Physics calculated a "decay factor" in order to prescribe his boost therapy. Per Dr. Marcelyn Ditty:  "Dr. Valere Dross, your patient needs approximately one extra fraction for the TDF to work out the closest.  It originally was 122.8 and would be 121.6 with an extra fraction assuming he didn't miss treatments during the 28 fraction regimen.  This is a total of 17 fractions remaining at 1.8 Gy per day."  Therefore he will receive 8100 cGy in 45 sessions.

## 2014-11-30 ENCOUNTER — Encounter: Payer: Self-pay | Admitting: Radiation Oncology

## 2014-11-30 DIAGNOSIS — C61 Malignant neoplasm of prostate: Secondary | ICD-10-CM | POA: Diagnosis not present

## 2014-11-30 NOTE — Progress Notes (Signed)
IMRT simulation/treatment planning note: The patient completed IMRT simulation/treatment planning today for continuation of his IMRT in the management of his carcinoma the prostate.  His dosimetry data was sent from [facility] for review and also for cumulative dose volume histograms analysis.  IMRT was chosen to decrease the risk for both acute and late bladder and rectal toxicity compared to 3-D conformal and conventional radiation therapy.  Dose volume histograms were obtained for the bladder, rectum, and femoral heads in addition to the target structure, prostate PTV.  We met our departmental/RTOG guidelines for the bladder rectum all and we did not meet [facility] guidelines for the rectal dosing.  He is treated with dual ARC VMAT IMRT.  We are delivering a further 3060 cGy in 17 sessions.

## 2014-11-30 NOTE — Progress Notes (Signed)
Special treatment procedure note: The patient underwent a special treatment procedure of for continuation of his IMRT.  We reviewed his previous treatment to the prostate along with his dose volume histograms.  Performed  cumulative DVHs in addition to the boost only dose volume histogram to assess dose to the bladder and rectum.  This required extra effort on the part of dosimetry and the physician for review and acceptance of the cumulative dose volume histograms.

## 2014-12-01 DIAGNOSIS — C61 Malignant neoplasm of prostate: Secondary | ICD-10-CM | POA: Diagnosis not present

## 2014-12-02 DIAGNOSIS — C61 Malignant neoplasm of prostate: Secondary | ICD-10-CM | POA: Diagnosis not present

## 2014-12-05 ENCOUNTER — Ambulatory Visit (INDEPENDENT_AMBULATORY_CARE_PROVIDER_SITE_OTHER): Payer: Managed Care, Other (non HMO) | Admitting: Family Medicine

## 2014-12-05 ENCOUNTER — Ambulatory Visit
Admission: RE | Admit: 2014-12-05 | Discharge: 2014-12-05 | Disposition: A | Discharge status: Home / Self Care | Payer: Managed Care, Other (non HMO) | Source: Ambulatory Visit | Attending: Radiation Oncology | Admitting: Radiation Oncology

## 2014-12-05 ENCOUNTER — Encounter: Payer: Self-pay | Admitting: Family Medicine

## 2014-12-05 ENCOUNTER — Ambulatory Visit
Admission: RE | Admit: 2014-12-05 | Payer: Managed Care, Other (non HMO) | Source: Ambulatory Visit | Admitting: Radiation Oncology

## 2014-12-05 VITALS — BP 160/105 | HR 74 | Wt 248.0 lb

## 2014-12-05 DIAGNOSIS — D72819 Decreased white blood cell count, unspecified: Secondary | ICD-10-CM

## 2014-12-05 DIAGNOSIS — R0602 Shortness of breath: Secondary | ICD-10-CM

## 2014-12-05 DIAGNOSIS — C61 Malignant neoplasm of prostate: Secondary | ICD-10-CM

## 2014-12-05 MED ORDER — ALBUTEROL SULFATE 108 (90 BASE) MCG/ACT IN AEPB: 1.0000 | INHALATION_SPRAY | Freq: Four times a day (QID) | RESPIRATORY_TRACT | Status: DC | PRN

## 2014-12-05 NOTE — Progress Notes (Signed)
CC: [facility] is a 59 y.o. male is here for discuss labs   Subjective: HPI:   Follow-up prostate cancer: He was scheduled to restart radiation therapy this morning with Dr. Valere Dross. I'm told by Dr. Valere Dross that a piece of equipment that was required for radiation therapy today was not operating and patient was unable to start radiation treatment today.  He expresses frustration centered around the fact that he was on the table for 20 minutes which was causing him back pain. He tells me he believes that something was going on and malfunctioned that has left him with a sensation that he can "taste that machine in my mouth".  He tells me he felt like he was in a fight or flight state and wanted to tear down the door to the treatment room he was in.  He tells me that he no longer wants to pursue any radiation treatment because he believes it's causing muscle wasting, shortness of breath, skin changes, low white blood cell count and he doesn't trust that the radiation is only being focused on his prostate.  Further description of shortness of breath is described as only present with exertion. He cannot run more than 40 yards without having to stop bend over and catch his breath. He denies any chest pain exertionally or with rest. He occasionally has wheezing if he is exposed to dust. He has a history of asthma as a child. He denies cough, fever, chills,orthopnea peripheral edema.  Follow-up leukopenia: Last week he had blood work taken where white blood cell count was 3.5 with a normal differential. Looking at for other readings over the past 2 years he's never had a low white blood cell count in this range. There has been no difficulty with swallowing lymph nodes, unintentional weight loss nor night sweats. No difficulty with finding of infections.   Review Of Systems Outlined In HPI  Past Medical History  Diagnosis Date  . Gout   . Depression   . Asthma     as child only  . Heartburn   .  Prostate cancer 09/28/13    Gleason 4+3=7, volume 18.8 gm  . Hypertension     Past Surgical History  Procedure Laterality Date  . Knee surgery    . Colonoscopy  11-10-2002  . Prostate biopsy  09/28/13    Gleason 7, volume 18.8 gm   Family History  Problem Relation Age of Onset  . Diabetes Mother   . Hypertension Mother   . Gout Mother   . Heart disease Mother   . Kidney disease Mother   . Colon cancer Neg Hx   . Diabetes Father   . Hypertension Father     History   Social History  . Marital Status: Married    Spouse Name: N/A  . Number of Children: N/A  . Years of Education: N/A   Occupational History  . Not on file.   Social History Main Topics  . Smoking status: Former Smoker -- 0.50 packs/day for 5 years    Quit date: 09/16/2006  . Smokeless tobacco: Never Used  . Alcohol Use: Yes     Comment: occ. glass wine  . Drug Use: No  . Sexual Activity: Not on file   Other Topics Concern  . Not on file   Social History Narrative     Objective: BP 160/105 mmHg  Pulse 74  Wt 248 lb (112.492 kg)  General: Alert and Oriented, No Acute Distress HEENT: Pupils equal,  round, reactive to light. Conjunctivae clear.  Moist mucous membranes Lungs: Clear to auscultation bilaterally, no wheezing/ronchi/rales.  Comfortable work of breathing. Good air movement. Cardiac: Regular rate and rhythm. Normal S1/S2.  No murmurs, rubs, nor gallops.   Extremities: No peripheral edema.  Strong peripheral pulses.  Mental Status: moderately anxious. No depression. Skin: Warm and dry.  Assessment & Plan: Alessandro was seen today for discuss labs.  Diagnoses and all orders for this visit:  Leukopenia Orders: -     CBC w/Diff  Prostate cancer  SOB (shortness of breath)  Other orders -     Albuterol Sulfate (PROAIR RESPICLICK) 010 (90 BASE) MCG/ACT AEPB; Inhale 1 puff into the lungs every 6 (six) hours as needed (shortness of breath or wheezing).   Leukopenia: Reassurance  provided that his differential was normal and it is not uncommon for individuals to have outlying blood cell counts above or below normal limits. We'll recheck a white blood cell count today with differential. He is understandably concerned about this because of family members that have died from "low blood counts". Prostate cancer: Earlier today Dr. Valere Dross spoke with me and confirmed that Mr. Shear is scheduled for starting radiation therapy tomorrow. The patient is adamant that he is going to abandon all efforts of treating his prostate cancer with radiation. I spent over 40 minutes with him trying to persuade him away from abandoning radiation treatment and discussing the progressive nature, morbidity and mortality of his prostate cancer if untreated. He tells me that he is going to [facility] and is going to try to find non-radiation route. I discussed with him it was clear that I believe he is making a big mistake with this decision and that I don't think that the radiation therapy itself is causing any problems other than some back pain.I've encouraged him to follow up and go through with his radiation appointment tomorrow. I also gave him the option of starting a number of different medication options to help calm him down prior to his radiation treatment and he repeatedly tells me that he is not interested in this because it still involves radiation treatment.  Shortness of breath: Trial of albuterol to see if this helps given his history of asthma. If no help next step will be echocardiogram  40 minutes spent face-to-face during visit today of which at least 50% was counseling or coordinating care regarding: 1. Leukopenia   2. Prostate cancer   3. SOB (shortness of breath)      Return in about 4 weeks (around 01/02/2015), or if symptoms worsen or fail to improve.

## 2014-12-05 NOTE — Progress Notes (Signed)
CC: Dr. Marcial Pacas  Weekly Management Note:  Bradley Harper was in the process of beginning his radiation therapy today for continuation of his external beam that was started at [facility].  Unfortunately, the LINAC developed a fault prior to his treatment and we were unable to treat him today.  He became quite anxious, manifesting symptoms of a panic attack.  He told me he wanted to discontinue radiation therapy.  He tells me that he had a good deal of anxiety during his treatment planning process last week which went very smoothly.  He plans on seeing Dr. Ileene Rubens this afternoon.  I told him that there could be some medications that can help him during this anxiety provoking treatment, but he tells me he is no longer going to take tranquilizers.  I wonder if he  would be a candidate for propranolol.  I requested that he have Dr. Ileene Rubens call me this afternoon.  I left a message with Dr. Lajoyce Lauber PA , Seth Bake. The patient understands that we really need to move ahead with his treatment to give him any opportunity for cure.

## 2014-12-06 ENCOUNTER — Ambulatory Visit: Admission: RE | Admit: 2014-12-06 | Payer: Managed Care, Other (non HMO) | Source: Ambulatory Visit

## 2014-12-06 LAB — CBC WITH DIFFERENTIAL/PLATELET
Basophils Absolute: 0 10*3/uL (ref 0.0–0.1)
Basophils Relative: 0 % (ref 0–1)
EOS ABS: 0.1 10*3/uL (ref 0.0–0.7)
Eosinophils Relative: 1 % (ref 0–5)
HCT: 42.6 % (ref 39.0–52.0)
Hemoglobin: 14.1 g/dL (ref 13.0–17.0)
LYMPHS PCT: 37 % (ref 12–46)
Lymphs Abs: 1.9 10*3/uL (ref 0.7–4.0)
MCH: 28.9 pg (ref 26.0–34.0)
MCHC: 33.1 g/dL (ref 30.0–36.0)
MCV: 87.3 fL (ref 78.0–100.0)
MONO ABS: 0.3 10*3/uL (ref 0.1–1.0)
MPV: 9.7 fL (ref 8.6–12.4)
Monocytes Relative: 6 % (ref 3–12)
NEUTROS ABS: 2.9 10*3/uL (ref 1.7–7.7)
NEUTROS PCT: 56 % (ref 43–77)
PLATELETS: 237 10*3/uL (ref 150–400)
RBC: 4.88 MIL/uL (ref 4.22–5.81)
RDW: 15.2 % (ref 11.5–15.5)
WBC: 5.2 10*3/uL (ref 4.0–10.5)

## 2014-12-07 ENCOUNTER — Ambulatory Visit: Admission: RE | Admit: 2014-12-07 | Payer: Managed Care, Other (non HMO) | Source: Ambulatory Visit

## 2014-12-07 ENCOUNTER — Ambulatory Visit
Admission: RE | Admit: 2014-12-07 | Discharge: 2014-12-07 | Disposition: A | Discharge status: Home / Self Care | Payer: Managed Care, Other (non HMO) | Source: Ambulatory Visit | Attending: Radiation Oncology | Admitting: Radiation Oncology

## 2014-12-08 ENCOUNTER — Ambulatory Visit
Admission: RE | Admit: 2014-12-08 | Discharge: 2014-12-08 | Disposition: A | Discharge status: Home / Self Care | Payer: Managed Care, Other (non HMO) | Source: Ambulatory Visit | Attending: Radiation Oncology | Admitting: Radiation Oncology

## 2014-12-08 ENCOUNTER — Ambulatory Visit: Admission: RE | Admit: 2014-12-08 | Payer: Managed Care, Other (non HMO) | Source: Ambulatory Visit

## 2014-12-08 ENCOUNTER — Ambulatory Visit: Payer: Managed Care, Other (non HMO)

## 2014-12-08 DIAGNOSIS — C61 Malignant neoplasm of prostate: Secondary | ICD-10-CM

## 2014-12-08 NOTE — Progress Notes (Signed)
CC: Dr. Marcial Pacas  I called [facility] several times yesterday but he did not return my calls.  He called earlier today and left a message.  In the meantime, he stopped by without a scheduled visit this morning while I was seeing a patient in consultation.  I spent better than 20 minutes listening to his complaints about how he felt that he was mistreated and that he did not feel confident that we could help him with radiation therapy.  He agrees that he has lost trust in our department just as he did at [facility].  I understand that he is seeking a non-radiation therapy alternative through the [facility].  I had him speak with our Chiropodist, [facility] to express his concerns to her.  His wife was in attendance, and I did welcome him back for treatment should he change his mind.  I also informed him he still has a potentially curable cancer, but if he does not have appropriate treatment,  he will probably die from metastatic cancer.  I believe that he is in denial about his underlying psychological issues which are a barrier to him receiving appropriate treatment.

## 2014-12-09 ENCOUNTER — Ambulatory Visit: Payer: Managed Care, Other (non HMO)

## 2014-12-12 ENCOUNTER — Ambulatory Visit: Payer: Managed Care, Other (non HMO)

## 2014-12-12 ENCOUNTER — Ambulatory Visit
Admission: RE | Admit: 2014-12-12 | Payer: Managed Care, Other (non HMO) | Source: Ambulatory Visit | Admitting: Radiation Oncology

## 2014-12-13 ENCOUNTER — Ambulatory Visit: Payer: Managed Care, Other (non HMO)

## 2014-12-14 ENCOUNTER — Ambulatory Visit: Payer: Managed Care, Other (non HMO)

## 2014-12-14 ENCOUNTER — Telehealth: Payer: Self-pay | Admitting: *Deleted

## 2014-12-14 NOTE — Telephone Encounter (Signed)
Pt left a message that he was having some issues that he wanted to discuss this am. Called pt back and he did tell me that after his last radiation tx he is still experiencing HA's, fatigue, SOB. He then stopped and said he would just make an appt to see [facility]. Pt was transferred up to the front to schedule an appt. I did him that it was ok to put him in the acute 915 slot tomorrow just   this time even though I feel his problem is not acute.

## 2014-12-15 ENCOUNTER — Ambulatory Visit: Payer: Managed Care, Other (non HMO)

## 2014-12-15 ENCOUNTER — Ambulatory Visit (INDEPENDENT_AMBULATORY_CARE_PROVIDER_SITE_OTHER): Payer: Managed Care, Other (non HMO)

## 2014-12-15 ENCOUNTER — Telehealth: Payer: Self-pay | Admitting: Family Medicine

## 2014-12-15 ENCOUNTER — Encounter: Payer: Self-pay | Admitting: Family Medicine

## 2014-12-15 ENCOUNTER — Ambulatory Visit (INDEPENDENT_AMBULATORY_CARE_PROVIDER_SITE_OTHER): Payer: Managed Care, Other (non HMO) | Admitting: Family Medicine

## 2014-12-15 VITALS — BP 150/106 | HR 77 | Wt 248.0 lb

## 2014-12-15 DIAGNOSIS — M546 Pain in thoracic spine: Secondary | ICD-10-CM

## 2014-12-15 DIAGNOSIS — C61 Malignant neoplasm of prostate: Secondary | ICD-10-CM | POA: Diagnosis not present

## 2014-12-15 DIAGNOSIS — M5134 Other intervertebral disc degeneration, thoracic region: Secondary | ICD-10-CM | POA: Diagnosis not present

## 2014-12-15 MED ORDER — ALBUTEROL SULFATE 108 (90 BASE) MCG/ACT IN AEPB: 1.0000 | INHALATION_SPRAY | Freq: Four times a day (QID) | RESPIRATORY_TRACT | Status: AC | PRN

## 2014-12-15 NOTE — Progress Notes (Signed)
CC: Bradley Harper is a 59 y.o. male is here for Follow-up   Subjective: HPI:  Follow-up prostate cancer: He has decided to cut ties with [facility] because he is suspicious that they were irradiating parts of his body other than the prostate when he was there last week and the machine was unable to be turned on. I let him know that Dr. Valere Harper personally called me to let me know that the machine would not fully turn on and there was never any communication about it causing radiation to be exposed to any part of the body at that visit. In fact my understanding is that no radiation was able to be used which is the reason why he did not the treatment that day. He tells me since that visit he's had a headache described as a tightness in the back of the neck that radiates up into both temples it is slightly improved with pressing on the left temple. Worse when looking to the left or right nothing else next better or worse. He's also had some midline thoracic back pain that radiates towards the bottom of the right scapula that's also worse with turning to the left or the right or when getting up from a seated position after a long period of rest. He was set up to go the [facility] however after they reviewed his case they recommended that he go back to [facility] to continue his radiation therapy.      Review Of Systems Outlined In HPI  Past Medical History  Diagnosis Date  . Gout   . Depression   . Asthma     as child only  . Heartburn   . Prostate cancer 09/28/13    Gleason 4+3=7, volume 18.8 gm  . Hypertension     Past Surgical History  Procedure Laterality Date  . Knee surgery    . Colonoscopy  11-10-2002  . Prostate biopsy  09/28/13    Gleason 7, volume 18.8 gm   Family History  Problem Relation Age of Onset  . Diabetes Mother   . Hypertension Mother   . Gout Mother   . Heart disease Mother   . Kidney disease Mother   . Colon cancer Neg  Hx   . Diabetes Father   . Hypertension Father     History   Social History  . Marital Status: Married    Spouse Name: N/A  . Number of Children: N/A  . Years of Education: N/A   Occupational History  . Not on file.   Social History Main Topics  . Smoking status: Former Smoker -- 0.50 packs/day for 5 years    Quit date: 09/16/2006  . Smokeless tobacco: Never Used  . Alcohol Use: Yes     Comment: occ. glass wine  . Drug Use: No  . Sexual Activity: Not on file   Other Topics Concern  . Not on file   Social History Narrative     Objective: BP 150/106 mmHg  Pulse 77  Wt 248 lb (112.492 kg)  SpO2 98%  Vital signs reviewed. General: Alert and Oriented, No Acute Distress HEENT: Pupils equal, round, reactive to light. Conjunctivae clear.  External ears unremarkable.  Moist mucous membranes. Lungs: Clear and comfortable work of breathing, speaking in full sentences without accessory muscle use. Cardiac: Regular rate and rhythm.  Neuro: CN II-XII grossly intact, gait normal. Extremities: No peripheral edema.  Strong peripheral pulses.  Back: Mild reproduction  midline back pain with palpation of the spinous process of T7, pain is also reproducible palpation of the lower rhomboid muscle on the right. No palpable abnormality the site of his discomfort. Mental Status: No depression, anxiety, nor agitation.  Skin: Warm and dry.  Assessment & Plan: [facility] was seen today for follow-up.  Diagnoses and all orders for this visit:  Prostate cancer Orders: -     Ambulatory referral to Radiation Oncology  Midline thoracic back pain Orders: -     DG Thoracic Spine W/Swimmers; Future  Other orders -     Albuterol Sulfate (PROAIR RESPICLICK) 194 (90 BASE) MCG/ACT AEPB; Inhale 1 puff into the lungs every 6 (six) hours as needed (shortness of breath or wheezing).   Prostate cancer: He is requesting a referral to [facility] because he wants to pursue other radiation options for  treatment of his prostate cancer. I've tried to calm down his suspicions that he was mistreated with [facility] team. I also let him know that I think that he is overly suspicious about medical community right now and this is interfering with his quality and timing of care. I've let him know that I strongly feel he needs to restart therapy as soon as possible, the fastest way would be to go through with Dr. Valere Harper however per his request I will place a referral to he's asking for. In order to screen for metastasis to the spine a x-ray of the thoracic spine will be ordered today, if normal will try different muscle relaxers to see if this helps with the spine and also his headache becomes a tension headache.   He was given a voucher for albuterol for free inhaler, thankfully he tells me that his shortness of breath has drastically improved since I saw him last, he just was to have this on hand.  40 minutes spent face-to-face during visit today of which at least 50% was counseling or coordinating care regarding: 1. Prostate cancer   2. Midline thoracic back pain      Return if symptoms worsen or fail to improve.

## 2014-12-15 NOTE — Telephone Encounter (Signed)
Pharmacy called to see if we were supposed to send over an Rx for pain. Spoke with [facility], determined Rx will be based on imaging results. Left message for Pt informing that once imaging is reviewed we will send an Rx. Callback information provided.

## 2014-12-16 ENCOUNTER — Ambulatory Visit: Payer: Managed Care, Other (non HMO)

## 2014-12-16 ENCOUNTER — Telehealth: Payer: Self-pay | Admitting: Family Medicine

## 2014-12-16 MED ORDER — METHOCARBAMOL 500 MG PO TABS: 500.0000 mg | ORAL_TABLET | Freq: Three times a day (TID) | ORAL | Status: DC | PRN

## 2014-12-16 NOTE — Telephone Encounter (Signed)
Pt notified of results and labs and imaging results left up front per patient request

## 2014-12-16 NOTE — Telephone Encounter (Signed)
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  Fortunately this can be improved by taking a muscle relaxer on a as needed basis, I've sent an rx of this called methocarbamol to his [facility].

## 2014-12-19 ENCOUNTER — Encounter: Payer: Self-pay | Admitting: Radiation Oncology

## 2014-12-19 ENCOUNTER — Ambulatory Visit: Payer: Managed Care, Other (non HMO)

## 2014-12-19 NOTE — Progress Notes (Signed)
CC: Dr. Marcial Pacas  Radiation summary note:  Brad[facility] Harper apparently decided to pursue continuation of radiation therapy or alternative therapies elsewhere.  He underwent treatment planning/simulation here but did not receive any treatment because of LINAC machine malfunction on his first scheduled day of therapy.  He did return to the clinic and I told him that I would more than happy to complete his therapy and/or look after him in the future should he so desire.  We have not heard back from him.

## 2014-12-20 ENCOUNTER — Ambulatory Visit: Payer: Managed Care, Other (non HMO)

## 2014-12-21 ENCOUNTER — Ambulatory Visit: Payer: Managed Care, Other (non HMO)

## 2014-12-22 ENCOUNTER — Ambulatory Visit: Payer: Managed Care, Other (non HMO)

## 2014-12-22 ENCOUNTER — Telehealth: Payer: Self-pay | Admitting: Family Medicine

## 2014-12-22 NOTE — Telephone Encounter (Signed)
Seth Bake, Will you please let patient know that [facility] has declined to provide services.  Would he be interested in me trying to find him a cancer center in [place]? (Response from [facility] sent to scan folder)

## 2014-12-22 NOTE — Telephone Encounter (Signed)
Patient was given Flexeril on 12/15/14 and it is not helping his pain.  Would like to know if you can recommend another medication.

## 2014-12-22 NOTE — Telephone Encounter (Signed)
Left message on vm

## 2014-12-23 ENCOUNTER — Ambulatory Visit: Payer: Managed Care, Other (non HMO)

## 2014-12-23 MED ORDER — TRAMADOL HCL 50 MG PO TABS: 50.0000 mg | ORAL_TABLET | Freq: Three times a day (TID) | ORAL | Status: DC | PRN

## 2014-12-23 NOTE — Telephone Encounter (Signed)
Bradley Harper, Can you please clarify this situation, I don't ever see flexeril on his medicaton list.  Perhaps he's referring to methocarbamol?  If so tramadol has been printed out to help with back pain.

## 2014-12-23 NOTE — Telephone Encounter (Signed)
Called to address this and to return call from when he called me earlier and vm not set up

## 2014-12-26 ENCOUNTER — Ambulatory Visit: Payer: Managed Care, Other (non HMO) | Admitting: Family Medicine

## 2014-12-26 ENCOUNTER — Encounter: Payer: Self-pay | Admitting: Family Medicine

## 2014-12-26 ENCOUNTER — Ambulatory Visit: Payer: Managed Care, Other (non HMO)

## 2014-12-26 ENCOUNTER — Ambulatory Visit: Payer: Managed Care, Other (non HMO) | Admitting: Sports Medicine

## 2014-12-26 ENCOUNTER — Ambulatory Visit (INDEPENDENT_AMBULATORY_CARE_PROVIDER_SITE_OTHER): Payer: Managed Care, Other (non HMO) | Admitting: Family Medicine

## 2014-12-26 VITALS — BP 162/111 | HR 86 | Wt 247.0 lb

## 2014-12-26 DIAGNOSIS — M109 Gout, unspecified: Secondary | ICD-10-CM | POA: Diagnosis not present

## 2014-12-26 DIAGNOSIS — C61 Malignant neoplasm of prostate: Secondary | ICD-10-CM

## 2014-12-26 MED ORDER — PREDNISONE 20 MG PO TABS: ORAL_TABLET | ORAL | Status: AC

## 2014-12-26 MED ORDER — TRAMADOL HCL 50 MG PO TABS: 50.0000 mg | ORAL_TABLET | Freq: Three times a day (TID) | ORAL | Status: AC | PRN

## 2014-12-26 NOTE — Progress Notes (Signed)
CC: Bradley Harper is a 59 y.o. male is here for f/u pain   Subjective: HPI:   right wrist pain that has been present for the past 3 days that began on Saturday when exposed to cold. Is described as throbbing, painful,  And radiating into the lateral aspect of the forearm. Worse with any  Movement of the wrist. Has improved with taking colchicine up to 1.2 mg in the morning and 0.6 mg in the evening. No other inerventions as of yet  And currently moderate in severity interfering quality of life.   He wants to  Review of his experience with multiple providers regarding his prostate cancer care.  He is convinced that he was  Receiving radiation when he had his  First plan treatment on the 21st of last month.  He believes that a malfunction occurred in he could have been receiving radiation for at least 30 minutes straight before they told him that  He was receiving no radiation and the machine was not  Operable on the day that he was there.  He tells me now that he believes that  Many if not all of  The providers that he  Has seen to this journey  Is part of a brotherhood and if there is a conspiracy against him from receiving appropriate care.  He had asked last visit to be referred to [facility],  This referral was placed  And I was contacted by  Fax that they have declined  To offer treatment because they did not feel that they could provided in a timely manner. He tells me that he call their offices and was told that  They did not have  Certain radiation treatments that he was pursuing.  He tells me that he is about to open investigation and on all of the physicians technicians and nurses that have been mishandling his care over the past 3 months.     Review Of Systems Outlined In HPI  Past Medical History  Diagnosis Date  . Gout   . Depression   . Asthma     as child only  . Heartburn   . Prostate cancer 09/28/13    Gleason 4+3=7, volume 18.8 gm  . Hypertension     Past Surgical History   Procedure Laterality Date  . Knee surgery    . Colonoscopy  11-10-2002  . Prostate biopsy  09/28/13    Gleason 7, volume 18.8 gm   Family History  Problem Relation Age of Onset  . Diabetes Mother   . Hypertension Mother   . Gout Mother   . Heart disease Mother   . Kidney disease Mother   . Colon cancer Neg Hx   . Diabetes Father   . Hypertension Father     History   Social History  . Marital Status: Married    Spouse Name: N/A  . Number of Children: N/A  . Years of Education: N/A   Occupational History  . Not on file.   Social History Main Topics  . Smoking status: Former Smoker -- 0.50 packs/day for 5 years    Quit date: 09/16/2006  . Smokeless tobacco: Never Used  . Alcohol Use: Yes     Comment: occ. glass wine  . Drug Use: No  . Sexual Activity: Not on file   Other Topics Concern  . Not on file   Social History Narrative     Objective: BP 162/111 mmHg  Pulse 86  Wt 247 lb (112.038  kg)    Vital signs reviewed. General: Alert and Oriented, No Acute Distress HEENT: Pupils equal, round, reactive to light. Conjunctivae clear.  External ears unremarkable.  Moist mucous membranes. Lungs: Clear and comfortable work of breathing, speaking in full sentences without accessory muscle use. Cardiac: Regular rate and rhythm.  Neuro: CN II-XII grossly intact, gait normal. Extremities: No peripheral edema.  Strong peripheral pulses.  Mild erythema and warmth to the right wrist lateral aspect greater than medial, no swelling or any other overlying skin changes Mental Status: mild anxiety. No depression or agitation. Skin: Warm and dry.  Assessment & Plan: Star was seen today for f/u pain.  Diagnoses and all orders for this visit:  Gout without tophus, unspecified cause, unspecified chronicity, unspecified site Orders: -     predniSONE (DELTASONE) 20 MG tablet; Three tabs daily days 1-3, two tabs daily days 4-6, one tab daily days 7-9, half tab daily days  10-13.  Prostate cancer Orders: -     PSA -     Ambulatory referral to Radiation Oncology  Other orders -     traMADol (ULTRAM) 50 MG tablet; Take 1 tablet (50 mg total) by mouth every 8 (eight) hours as needed (back pain).   Gout: Prednisone taper and as needed tramadol Prostate cancer: I tried my best to assure him that I know of no mishandling of his care other than a light shining in his eye at [facility] which caused him a headache and his decision to leave that care team and also a malfunctioning piece of equipment with Dr. Charlton Amor team with no concrete evidence that he was ever exposed to radiation at that visit, which resulted in his decision to leave that care team. I reassured him that I'm not part of any conspiracy in the right seriously doubt any conspiracy is occurring.I welcomed him to request all medical records that we have to help show some transparency. He is requesting that a PSA be checked today which I'll happily provide him with however I stressed the importance that we need to find a cancer center that will accept him and if not he needs to see we can reestablish care with either [facility] or Dr. Charlton Amor team.  At this point I can't tell how much of his decision-making process and thought processes due to a general mistrust of the medical field our views actually developing significantdelusions and paranoia  40 minutes spent face-to-face during visit today of which at least 50% was counseling or coordinating care regarding: 1. Gout without tophus, unspecified cause, unspecified chronicity, unspecified site   2. Prostate cancer      Return if symptoms worsen or fail to improve.

## 2014-12-26 NOTE — Telephone Encounter (Signed)
Pt has an appointment today

## 2014-12-27 ENCOUNTER — Ambulatory Visit: Payer: Managed Care, Other (non HMO)

## 2014-12-27 LAB — PSA: PSA: 0.04 ng/mL (ref <=4.00)

## 2014-12-28 ENCOUNTER — Ambulatory Visit: Payer: Managed Care, Other (non HMO)

## 2014-12-28 ENCOUNTER — Telehealth: Payer: Self-pay | Admitting: *Deleted

## 2014-12-28 NOTE — Telephone Encounter (Signed)
Spoke with patient earlier and gave him his lab results and pt asked what Dr. Lajoyce Lauber recommendation is in re his treatment. Pt has an appt scheduled with another cancer treatment center in [place].I advised the patient since Dr. Ileene Rubens is not an oncologist it would be the oncologist who would make recommendations about his form of treatment. The patient states he wants a specific type of treatment and if the center he was referred to does not offer what he specifically wants then he doesn't want to drive that far for them to tell him that they cannot help him. Pt states Dr. Ileene Rubens is aware of what he wants since they have discussed this and he wants to know what his recommendation is. i said to the patient that I would rout a phone note to him and call him with any further recommendations he may have.

## 2014-12-28 NOTE — Telephone Encounter (Signed)
[facility] healthcare system claims that they offer surgical options, internal or external radiation therapy, chemotherapy, hormone therapy, and clinical trials.  The oncologist he meets with will help him determine which option has the best chance of cure.

## 2014-12-29 ENCOUNTER — Ambulatory Visit: Payer: Managed Care, Other (non HMO)

## 2014-12-29 NOTE — Telephone Encounter (Signed)
Message left on vm 

## 2015-01-09 ENCOUNTER — Encounter: Payer: Self-pay | Admitting: Family Medicine

## 2015-01-09 ENCOUNTER — Ambulatory Visit (INDEPENDENT_AMBULATORY_CARE_PROVIDER_SITE_OTHER): Payer: Managed Care, Other (non HMO) | Admitting: Family Medicine

## 2015-01-09 VITALS — BP 140/98 | HR 89 | Wt 245.0 lb

## 2015-01-09 DIAGNOSIS — I10 Essential (primary) hypertension: Secondary | ICD-10-CM | POA: Diagnosis not present

## 2015-01-09 DIAGNOSIS — M1000 Idiopathic gout, unspecified site: Secondary | ICD-10-CM

## 2015-01-09 MED ORDER — LOSARTAN POTASSIUM 100 MG PO TABS: 100.0000 mg | ORAL_TABLET | Freq: Every day | ORAL | Status: DC

## 2015-01-09 NOTE — Progress Notes (Signed)
CC: Bradley Harper is a 59 y.o. male is here for Gout   Subjective: HPI:  Follow-up gout: Currently taking colchicine twice a day. No GI disturbance that he is aware of. He's been having mild flares in his wrist off and on since I saw him last. Currently pain is mild in severity with a warmth sensation in the right wrist. No swelling or overnight redness. No weakness in the right upper extremity or elsewhere. Symptoms are worse when exposed to cold weather.  Follow-up essential hypertension: Continue to take lisinopril on a daily basis. Since I saw him last he checked his blood pressure once and reports a value that was the stage II hypertension range. He took an additional lisinopril and blood pressure came down to what he believes was normal. Denies chest pain shortness of breath orthopnea or peripheral edema nor motor or sensory disturbances.   Review Of Systems Outlined In HPI  Past Medical History  Diagnosis Date  . Gout   . Depression   . Asthma     as child only  . Heartburn   . Prostate cancer 09/28/13    Gleason 4+3=7, volume 18.8 gm  . Hypertension     Past Surgical History  Procedure Laterality Date  . Knee surgery    . Colonoscopy  11-10-2002  . Prostate biopsy  09/28/13    Gleason 7, volume 18.8 gm   Family History  Problem Relation Age of Onset  . Diabetes Mother   . Hypertension Mother   . Gout Mother   . Heart disease Mother   . Kidney disease Mother   . Colon cancer Neg Hx   . Diabetes Father   . Hypertension Father     History   Social History  . Marital Status: Married    Spouse Name: N/A  . Number of Children: N/A  . Years of Education: N/A   Occupational History  . Not on file.   Social History Main Topics  . Smoking status: Former Smoker -- 0.50 packs/day for 5 years    Quit date: 09/16/2006  . Smokeless tobacco: Never Used  . Alcohol Use: Yes     Comment: occ. glass wine  . Drug Use: No  . Sexual Activity: Not on file   Other Topics  Concern  . Not on file   Social History Narrative     Objective: BP 140/98 mmHg  Pulse 89  Wt 245 lb (111.131 kg)  General: Alert and Oriented, No Acute Distress HEENT: Pupils equal, round, reactive to light. Conjunctivae clear.  Moist mucous membranes Lungs: Clear to auscultation bilaterally, no wheezing/ronchi/rales.  Comfortable work of breathing. Good air movement. Cardiac: Regular rate and rhythm. Normal S1/S2.  No murmurs, rubs, nor gallops.   Extremities: No peripheral edema.  Strong peripheral pulses. No swelling redness or warmth of the right wrist Mental Status: No depression, anxiety, nor agitation. Skin: Warm and dry.  Assessment & Plan: Bradley Harper was seen today for gout.  Diagnoses and all orders for this visit:  Idiopathic gout, unspecified chronicity, unspecified site Orders: -     Uric acid  Essential hypertension, benign  Other orders -     losartan (COZAAR) 100 MG tablet; Take 1 tablet (100 mg total) by mouth daily. Replaces Lisinopril.   Gout: Uncontrolled right condition, adjusting blood pressure medication in hopes of improving uric acid level. He tells me he is intolerant of probenecid, uloric  and allopurinol. Essential hypertension: Uncontrolled chronic condition switching blood pressure medication to  losartan, discussed that there is some evidence that this medication can help LOWER uric acid levels although it's primarily going to be used for blood pressure control.   Return in about 4 weeks (around 02/06/2015) for Uric Acid Lab Visit.

## 2015-01-11 ENCOUNTER — Telehealth: Payer: Self-pay | Admitting: Family Medicine

## 2015-01-11 ENCOUNTER — Telehealth: Payer: Self-pay | Admitting: *Deleted

## 2015-01-11 NOTE — Telephone Encounter (Addendum)
Called pt to asked him about the reason he called earlier today. He states he took losartan and after he took losartan  his hand became very swollen and so he stopped the losartan.  I placed him on hold and went and told dr. Ileene Rubens what his concerns were. Dr. Ileene Rubens recommended another blood pressure be called in and to stop the losartan. I advised the patient that Dr. Ileene Rubens would send in another BP medication and to discontinue losartan.He states he is starting to become unhappy with his care here because he shouldn't have to wait so long for someone to call him back. He then asked me if I thought that it was ok for someone to wait so long for a call back.I did tell him that I did call him this am twice back to back and each time I could hear him but it did seem as if he could not hear me. I told him that after that instance happened I went to our supervisor and informed her of an issue with my phone and she in turn called the technician who works on our phone and our phone system. I reminded him that I am the doctors assistant and I am working with patients all day long. I also told him that it states on my voice mail that my messages get checked before lunch and at the end of clinic and if he has a more pressing issue that cannot wait to call the triage line. He says that he thought all of his concerns should be addressed to me since I am the assistant. I explained to him that we do have assistants here that specifically handle the calls from patients and those assistants  do not work with providers on regular basis. They are more able to returns calls faster than assistants that work in clinic with a provider. He then asks if it possible that Dr. Ileene Rubens himself call back when he has called with an issue or if he still has to relay information through me. I again told him that Dr. Ileene Rubens would not be able to personally call patients back during the day because he is seeing scheduled patients through out the day.  He then says that he was in pain yesterday and had to wait around all day in pain today. He states at this point the side effects from the  medications that Dr. Ileene Rubens is prescribing seem to outweigh the benefits. He is tired of spending money on office visits, tired of getting medication that is not providing any benefit and tired of getting the runaround altogether. He tells me that he is not working at this time and the quality of his health is declining and his health is very important to him. I did tell him that I do understand that his health is important to him and that I would relay this message to  to Dr. Ileene Rubens and if their were any other recommendations from Dr. Ileene Rubens at this time that I would call him back.

## 2015-01-11 NOTE — Telephone Encounter (Signed)
Pt called yesterday and left a message that he was having a problem with his medications. Called him back this am and he couldn't hear me and his phone kept cutting off. We got d/c

## 2015-01-11 NOTE — Telephone Encounter (Signed)
Bradley Harper dropped off a letter this week asking that we not share or access any of his information without his permission.  For legal purposes I'd like to document that this is standard practice for our patients however I assume he's additionally referring to not wanting [place] to send any info to referral sources without his permission.   Will attempt to highlight this request in the Epic system for other users.

## 2015-01-12 MED ORDER — LISINOPRIL 20 MG PO TABS: ORAL_TABLET | ORAL | Status: AC

## 2015-01-12 NOTE — Telephone Encounter (Signed)
I would recommend restarting his former regimen of Lisinopril 20mg .  This was changed only because there is data that losartan helps lower uric acid levels however if is causing pain or swelling of the joints I'd agree that the potential benefits outweigh the harm.  Refills of lisinopril sent to [facility]

## 2015-01-12 NOTE — Telephone Encounter (Signed)
Called pt and notified him of Dr. Theron Arista below advice. Pt voiced understanding. Pt asked if he can take indomethacin along with  Colchicine. After speaking with Dr. Ileene Rubens I did tell patient that he could take the indomethacin along with the colchicine while having a gout flare. I told him that indomethacin can cause stomach ulcers so only take it during a flare. Pt voice understanding and thanked me for calling him back

## 2017-02-15 IMAGING — CR DG ELBOW COMPLETE 3+V*R*
4 series · 4 of 4 positions shown · non-contrast
Comparison: None.

CLINICAL DATA: Weakness. Muscle wasting. History of prostate
carcinoma

EXAM:
RIGHT ELBOW - COMPLETE 3+ VIEW

[elbow ap]
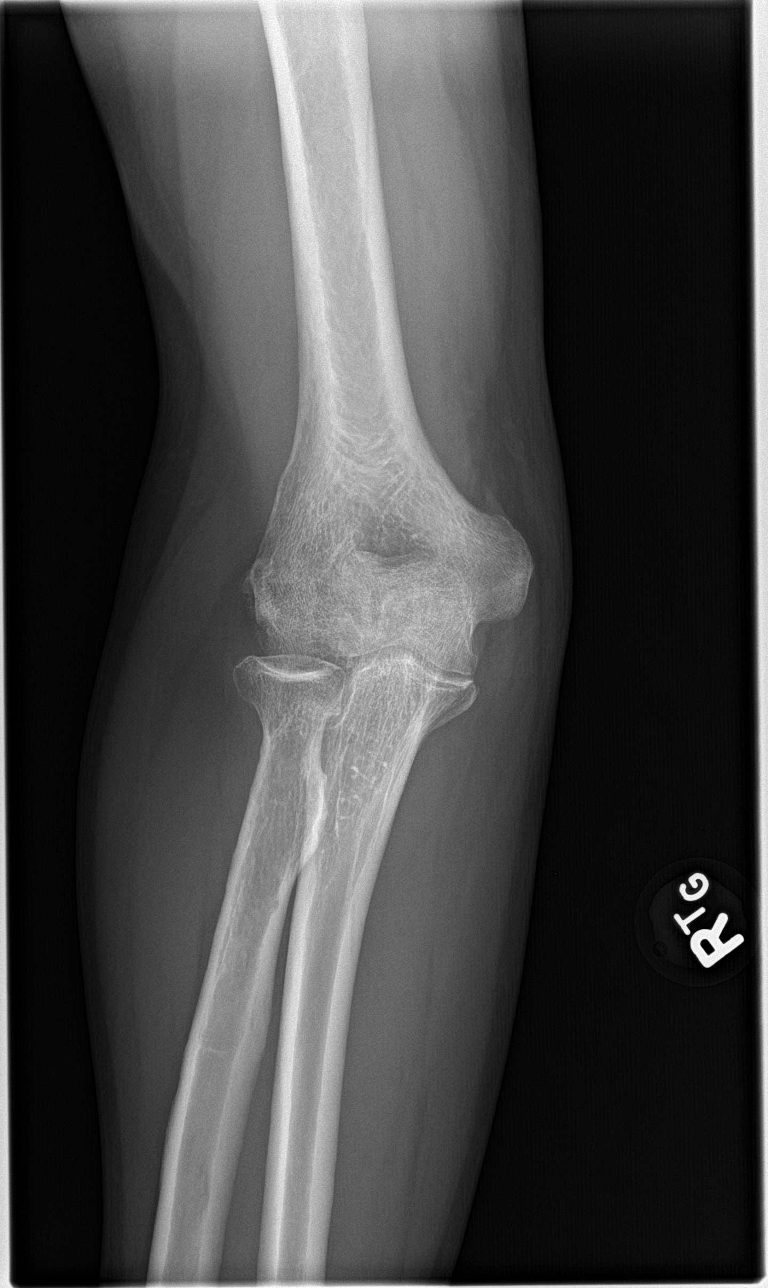

[elbow obl (1 of 2)]
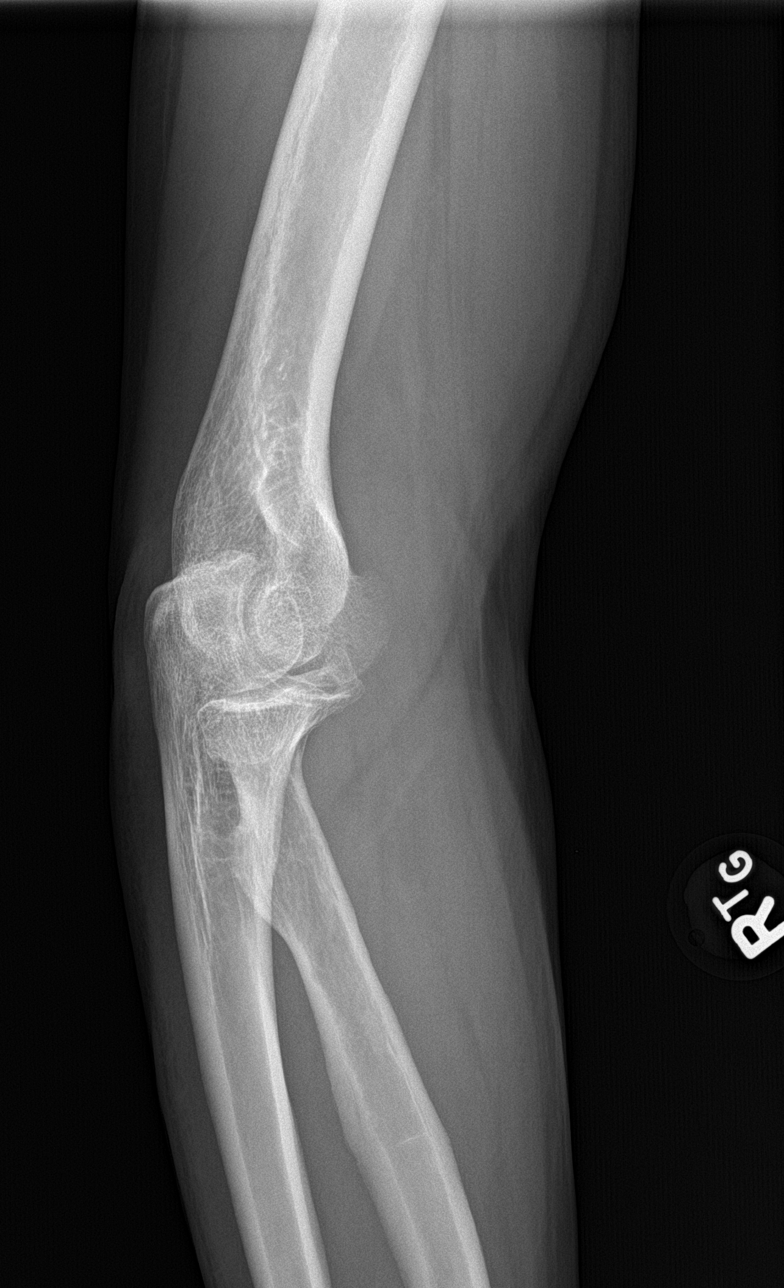

[elbow obl (2 of 2)]
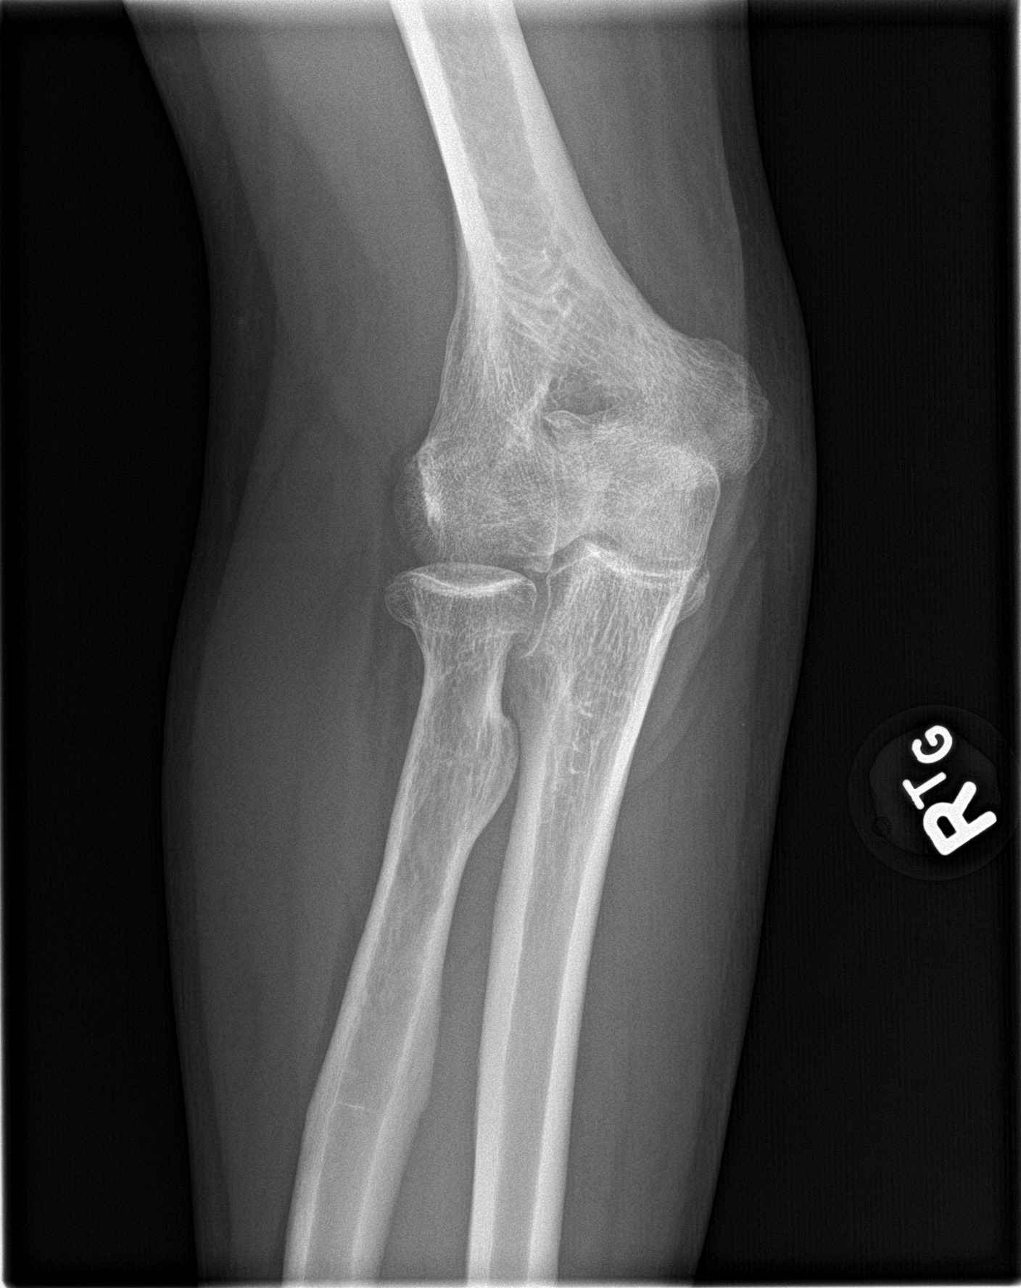

[elbow lat]
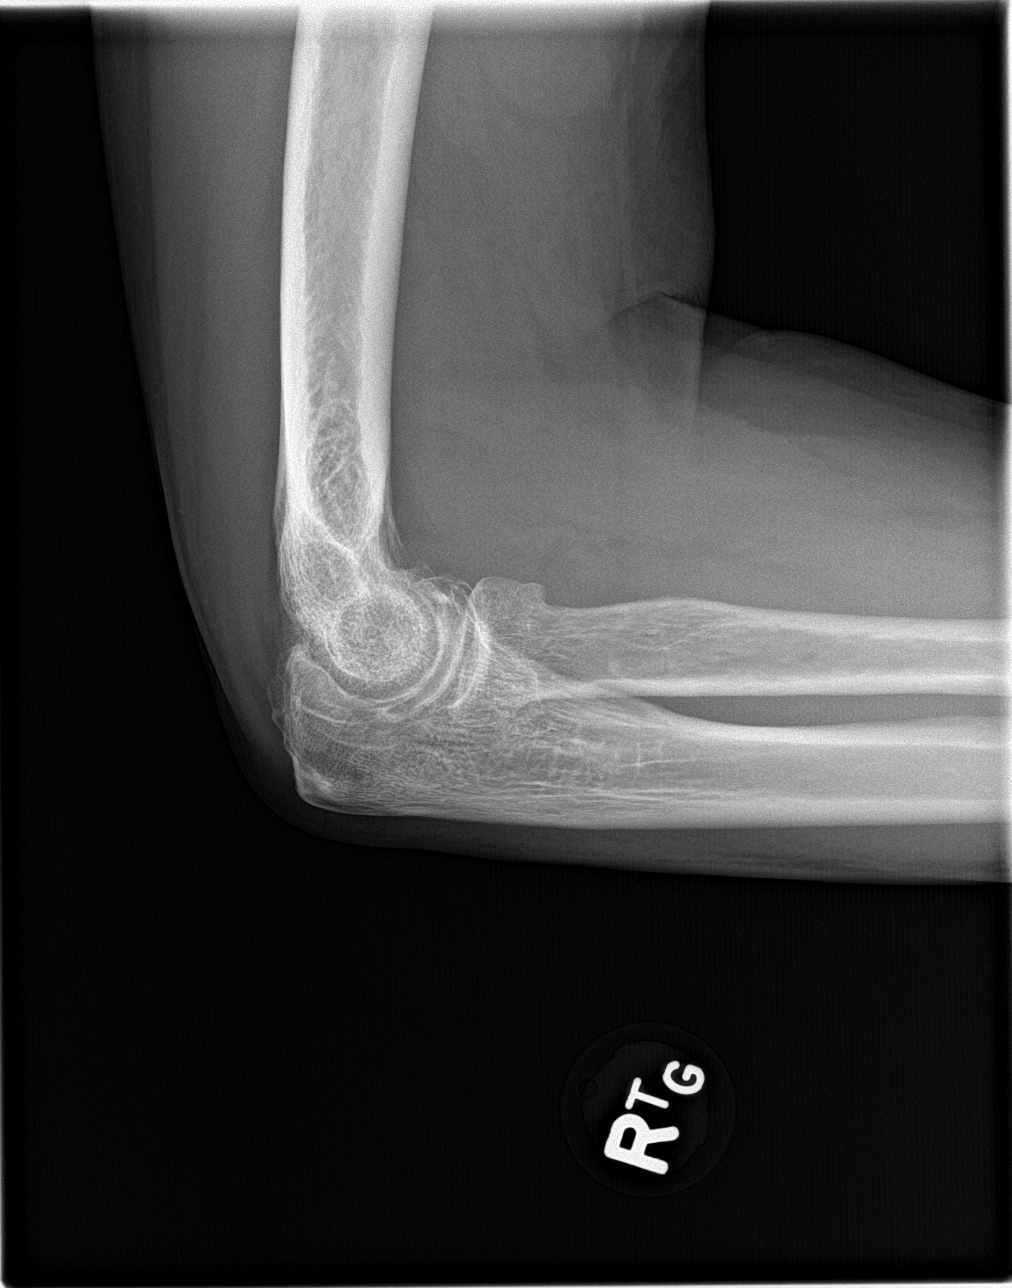

[4 of 4 positions shown; findings below may reference images not displayed]

FINDINGS: Frontal, lateral, and bilateral oblique views were obtained. No
blastic or lytic bone lesions. No fracture or dislocation. No
appreciable joint effusion. There is slight joint space narrowing.
No erosive change.
IMPRESSION: Mild osteoarthritic change. No neoplastic focus. No fracture or
dislocation.

## 2017-12-15 DEATH — deceased
